# Patient Record
Sex: Male | Born: 1985 | Race: Black or African American | Hispanic: No | Marital: Married | Smoking: Never smoker
Health system: Southern US, Community
[De-identification: ages and names within clinical notes are randomized; demographics above are authoritative.]

## PROBLEM LIST (undated history)

## (undated) DIAGNOSIS — J45909 Unspecified asthma, uncomplicated: Secondary | ICD-10-CM

## (undated) DIAGNOSIS — E119 Type 2 diabetes mellitus without complications: Secondary | ICD-10-CM

---

## 2009-11-01 ENCOUNTER — Ambulatory Visit: Payer: Self-pay | Admitting: Diagnostic Radiology

## 2009-11-01 ENCOUNTER — Encounter: Payer: Self-pay | Admitting: Emergency Medicine

## 2010-02-17 ENCOUNTER — Inpatient Hospital Stay (HOSPITAL_COMMUNITY): Admission: EM | Admit: 2010-02-17 | Discharge: 2009-11-03 | Payer: Self-pay | Admitting: Emergency Medicine

## 2010-05-27 LAB — BASIC METABOLIC PANEL
CO2: 29 mEq/L (ref 19–32)
Creatinine, Ser: 1.2 mg/dL (ref 0.4–1.5)
GFR calc Af Amer: >60 mL/min (ref >60)
GFR calc non Af Amer: >60 mL/min (ref >60)

## 2010-05-27 LAB — CULTURE, BLOOD (ROUTINE X 2)

## 2010-05-27 LAB — WOUND CULTURE: Gram Stain: NONE SEEN

## 2010-05-27 LAB — DIFFERENTIAL
Basophils Absolute: 0 10*3/uL (ref 0.0–0.1)
Basophils Relative: 1 % (ref 0–1)
Lymphs Abs: 2.3 10*3/uL (ref 0.7–4.0)
Neutro Abs: 1.9 10*3/uL (ref 1.7–7.7)
Neutrophils Relative %: 37 % — ABNORMAL LOW (ref 43–77)

## 2010-05-27 LAB — ANAEROBIC CULTURE: Gram Stain: NONE SEEN

## 2010-05-27 LAB — CBC
Hemoglobin: 13.5 g/dL (ref 13.0–17.0)
MCHC: 34 g/dL (ref 30.0–36.0)
RBC: 4.25 MIL/uL (ref 4.22–5.81)

## 2011-01-04 ENCOUNTER — Encounter: Payer: Self-pay | Admitting: Hematology and Oncology

## 2011-01-18 ENCOUNTER — Other Ambulatory Visit: Payer: Self-pay | Admitting: Hematology and Oncology

## 2011-01-18 ENCOUNTER — Ambulatory Visit: Payer: 59

## 2011-01-18 ENCOUNTER — Ambulatory Visit (HOSPITAL_BASED_OUTPATIENT_CLINIC_OR_DEPARTMENT_OTHER): Payer: 59 | Admitting: Hematology and Oncology

## 2011-01-18 ENCOUNTER — Ambulatory Visit: Payer: Medicare HMO

## 2011-01-18 VITALS — BP 129/80 | HR 47 | Temp 98.1°F | Ht 74.5 in | Wt 260.6 lb

## 2011-01-18 DIAGNOSIS — D539 Nutritional anemia, unspecified: Secondary | ICD-10-CM

## 2011-01-18 DIAGNOSIS — D649 Anemia, unspecified: Secondary | ICD-10-CM

## 2011-01-18 LAB — URINALYSIS, MICROSCOPIC - CHCC
Bilirubin (Urine): NEGATIVE
Glucose: NEGATIVE g/dL
Leukocyte Esterase: NEGATIVE
Nitrite: NEGATIVE
Specific Gravity, Urine: 1.025 (ref 1.003–1.035)

## 2011-01-18 LAB — RETICULOCYTES: Retic Ct Abs: 49.14 10*3/uL (ref 34.80–93.90)

## 2011-01-18 LAB — CHCC SMEAR

## 2011-01-18 NOTE — Progress Notes (Signed)
This office note has been dictated.

## 2011-01-18 NOTE — Progress Notes (Signed)
NO    [facility]     [place]  [facility]  On  [address]  Cell     Phone      [phone].

## 2011-01-19 NOTE — Progress Notes (Signed)
CC:   Marye Round, MD  IDENTIFYING STATEMENT:  The patient is a 25 year old man seen at request of Dr. Derrek Gu with anemia.  HISTORY OF PRESENT ILLNESS:  The patient was undergoing routine physical exam and was found to be mildly anemic.  He has no past medical history and is in general good health.  He states that he has had no loss in weight.  He denies rectal bleeding, hemoptysis hematemesis.  He does not think he has a personal or family history for sickle cell disease.  He eats a well-balanced diet.  He has good energy levels.  He denies musculoskeletal pain.  CBC at an office visit on 12/26/2010 had noted a white cell count of 3.4, hemoglobin 13, hematocrit 39, platelets 165.  The patient does not have other lab values on file to review at this time.  PAST MEDICAL HISTORY: 1. Remote history of asthma. 2. Status post I and D following a dog bit to thumb. 3. Status post wisdom tooth extraction.  MEDICATIONS:  Ibuprofen 500 mg b.i.d.  Completing course of penicillin following tooth extraction.  ALLERGIES:  None.  SOCIAL HISTORY:  The patient is engaged.  He is a Armed forces training and education officer. Denies alcohol, tobacco use.  FAMILY HISTORY:  Patient's maternal grandmother and father had anemia and both were managed with iron.  Denies hematological or oncologic malignancies.  REVIEW OF SYSTEMS:  Denies fever, chills, night sweats, anorexia, weight loss.  Cardiovascular:  Denies chest pain, PND, orthopnea, ankle swelling.  Respirations:  Denies cough hemoptysis, wheeze, shortness of breath.  Musculoskeletal:  Denies joint aches, muscle pains. Neurologic:  Denies headaches, vision changes, extremity weakness.  Rest of review of systems negative.  PHYSICAL EXAM:  General:  The patient is alert and oriented x3.  Vitals signs:  Pulse 47, blood pressure 129/80, temperature 98.1, respirations 20, weight 260 pounds.  HEENT:  Head is atraumatic, normocephalic. Extraocular muscles  intact.  Sclerae anicteric.  Pupils equal, round, reactive to light.  Mouth moist without ulcerations, thrush, or lesions. Neck:  Supple without adenopathy and trachea center.  Chest: Demonstrates good entry bilaterally and clear to both percussion auscultation.  Cardiovascular:  Reveals 1st and 2nd heart sounds are present.  No added sounds or murmurs.  Abdomen:  Soft, nontender.  Bowel sounds present.  Extremities:  No calf tenderness.  Pulses present and symmetrical.  Lymph nodes:  No palpable adenopathy.  CNS:  Nonfocal.  IMPRESSION AND PLAN:  Mr. Wain is a 25 year old man evaluated for anemia which appears to be mild.  His history and exam are essentially unremarkable.  He will proceed with anemia workup that will include a repeat CBC with differential and repeat smear.  We will also analyze LFTs.  We will rule out hemolysis with Coombs, haptoglobin and LDH.  We will also obtain a hemoglobin electrophoresis to rule out hemoglobinopathy.  He is given card for occult blood testing.  He will also obtain urinalysis.  We spent time discussing potential etiologies of anemia.  All questions were answered.  We spent 50% of time discussing potential outcomes and recommendations.  The patient follows up to discuss results.    ______________________________ Laurice Record, M.D. LIO/MEDQ  D:  01/18/2011  T:  01/18/2011  Job:  [id number]

## 2011-01-20 LAB — HEMOGLOBINOPATHY EVALUATION
Hemoglobin Other: 0 %
Hgb A2 Quant: 3 % (ref 2.2–3.2)
Hgb F Quant: 0 % (ref 0.0–2.0)

## 2011-01-24 ENCOUNTER — Ambulatory Visit (HOSPITAL_BASED_OUTPATIENT_CLINIC_OR_DEPARTMENT_OTHER): Payer: 59

## 2011-01-24 ENCOUNTER — Other Ambulatory Visit: Payer: Self-pay | Admitting: *Deleted

## 2011-01-24 DIAGNOSIS — D72819 Decreased white blood cell count, unspecified: Secondary | ICD-10-CM

## 2011-01-24 DIAGNOSIS — D539 Nutritional anemia, unspecified: Secondary | ICD-10-CM

## 2011-01-24 DIAGNOSIS — D649 Anemia, unspecified: Secondary | ICD-10-CM

## 2011-01-24 LAB — CBC WITH DIFFERENTIAL/PLATELET
Eosinophils Absolute: 0.1 10*3/uL (ref 0.0–0.5)
MCV: 91.3 fL (ref 79.3–98.0)
MONO#: 0.2 10*3/uL (ref 0.1–0.9)
MONO%: 7 % (ref 0.0–14.0)
NEUT#: 1.4 10*3/uL — ABNORMAL LOW (ref 1.5–6.5)
RBC: 4.05 10*6/uL — ABNORMAL LOW (ref 4.20–5.82)
RDW: 12.4 % (ref 11.0–14.6)
WBC: 3.1 10*3/uL — ABNORMAL LOW (ref 4.0–10.3)
lymph#: 1.4 10*3/uL (ref 0.9–3.3)

## 2011-01-25 LAB — COMPREHENSIVE METABOLIC PANEL
ALT: 44 U/L (ref 0–53)
Alkaline Phosphatase: 51 U/L (ref 39–117)
CO2: 28 mEq/L (ref 19–32)
Sodium: 140 mEq/L (ref 135–145)
Total Bilirubin: 0.5 mg/dL (ref 0.3–1.2)
Total Protein: 7.6 g/dL (ref 6.0–8.3)

## 2011-01-25 LAB — FOLATE: Folate: 9.7 ng/mL

## 2011-01-25 LAB — LACTATE DEHYDROGENASE: LDH: 145 U/L (ref 94–250)

## 2011-01-25 LAB — FERRITIN: Ferritin: 48 ng/mL (ref 22–322)

## 2011-01-25 LAB — IRON AND TIBC: Iron: 73 ug/dL (ref 42–165)

## 2011-01-26 ENCOUNTER — Ambulatory Visit (HOSPITAL_BASED_OUTPATIENT_CLINIC_OR_DEPARTMENT_OTHER): Payer: 59 | Admitting: Hematology and Oncology

## 2011-01-26 VITALS — BP 146/71 | HR 64 | Temp 98.6°F | Ht 74.5 in | Wt 252.4 lb

## 2011-01-26 DIAGNOSIS — D72819 Decreased white blood cell count, unspecified: Secondary | ICD-10-CM

## 2011-01-26 DIAGNOSIS — D539 Nutritional anemia, unspecified: Secondary | ICD-10-CM | POA: Insufficient documentation

## 2011-01-26 DIAGNOSIS — D649 Anemia, unspecified: Secondary | ICD-10-CM

## 2011-01-26 NOTE — Progress Notes (Signed)
This office note has been dictated.

## 2011-01-26 NOTE — Progress Notes (Signed)
CC:   Jeanice Lim, M.D.  IDENTIFYING STATEMENT:  The patient is a 25 year old man who presents to discuss results.  INTERVAL HISTORY:  Mr. Germond was found to have mild anemia following a recent routine physical.  He was asymptomatic with no significant past medical history.  There is no history for overt blood loss.  His weight has been stable.  The patient's lab results are as follows:  On 01/24/2011, white cell count 3.1, hemoglobin 12.8, hematocrit 37, platelets 196, ANC 1,400. Review of peripheral smear was essentially unremarkable; iron 73, TIBC 229, saturation 32%, ferritin 48, B12 of 382, folate 9.7; there was no evidence of hemolysis as the haptoglobin was normal at 106, LDH 145, and direct and indirect Coombs negative; fecal occult blood testing x3 was negative; a urinalysis was negative for hematuria; hemoglobin electrophoresis did not reveal hemoglobinopathy.  The retic count was normal at 1.30.  PAST MEDICAL HISTORY:  Unchanged.  SOCIAL HISTORY:  Unchanged.  FAMILY HISTORY:  Unchanged.  MEDICATIONS:  Reviewed and updated.  ALLERGIES:  None.  REVIEW OF SYSTEMS:  A 10-point review of systems is negative.  PHYSICAL EXAMINATION:  General Appearance:  The patient is a well- appearing, well-nourished man in no distress.  Vital Signs:  Pulse 64. Blood pressure 146/71.  Temp 98.6.  Respirations 20.  Weight 252 pounds. HEENT:  Head is atraumatic, normocephalic.  Sclerae are anicteric. Mouth is moist.  No thrush.  Chest:  Clear.  CVS:  Unremarkable. Abdomen:  Soft, nontender.  Bowel sounds are present.  Extremities:  No calf tenderness.  No edema.  Lymph Nodes:  No adenopathy.  RUE:[id number].  LABORATORY DATA:  As above.  In addition, sodium 140, potassium 4.4, chloride 104, CO2 of 25, BUN 17, creatinine 1.09, glucose 84, t. bili 0.5, alkaline phosphatase 51, AST 22, ALT 44, calcium 9.8.  IMPRESSION AND PLAN:  Mr. Loomer is a 25 year old man with mild leukopenia and mild  anemia.  He is asymptomatic.  Likely, this may be nutritional as his B12 levels are slightly on the lower side of normal and his ferritin level is also on the lower scale of normal.  I have asked that he begin a multivitamin.  Begin over-the-counter iron once  daily for 3 months, and B12 100 mcg daily for 3 months.    He will followup in 6 months' time with repeat labs.    ______________________________ Laurice Record, M.D. LIO/MEDQ  D:  01/26/2011  T:  01/26/2011  Job:  [id number]

## 2011-08-10 ENCOUNTER — Other Ambulatory Visit: Payer: Self-pay | Admitting: *Deleted

## 2011-08-15 ENCOUNTER — Telehealth: Payer: Self-pay | Admitting: Hematology and Oncology

## 2011-08-15 NOTE — Telephone Encounter (Signed)
Per 5/30 pof moved 6/12 f/u from LO to [facility] and lb b4 visit. S/w pt today he is aware of new lb d/t for 6/5 @ 1:45 pm. F/u remains 6/12 @ 11 am.

## 2011-08-16 ENCOUNTER — Other Ambulatory Visit (HOSPITAL_BASED_OUTPATIENT_CLINIC_OR_DEPARTMENT_OTHER): Payer: Self-pay | Admitting: Lab

## 2011-08-16 DIAGNOSIS — D72819 Decreased white blood cell count, unspecified: Secondary | ICD-10-CM

## 2011-08-16 DIAGNOSIS — D649 Anemia, unspecified: Secondary | ICD-10-CM

## 2011-08-16 LAB — BASIC METABOLIC PANEL
CO2: 27 mEq/L (ref 19–32)
Calcium: 9.6 mg/dL (ref 8.4–10.5)
Chloride: 105 mEq/L (ref 96–112)
Creatinine, Ser: 0.93 mg/dL (ref 0.50–1.35)
Glucose, Bld: 106 mg/dL — ABNORMAL HIGH (ref 70–99)
Sodium: 139 mEq/L (ref 135–145)

## 2011-08-16 LAB — CBC WITH DIFFERENTIAL/PLATELET
Basophils Absolute: 0 10*3/uL (ref 0.0–0.1)
Eosinophils Absolute: 0.1 10*3/uL (ref 0.0–0.5)
HGB: 12.8 g/dL — ABNORMAL LOW (ref 13.0–17.1)
LYMPH%: 56.2 % — ABNORMAL HIGH (ref 14.0–49.0)
MCV: 91.2 fL (ref 79.3–98.0)
MONO#: 0.2 10*3/uL (ref 0.1–0.9)
MONO%: 5.3 % (ref 0.0–14.0)
NEUT#: 1.1 10*3/uL — ABNORMAL LOW (ref 1.5–6.5)
Platelets: 166 10*3/uL (ref 140–400)
RBC: 4.07 10*6/uL — ABNORMAL LOW (ref 4.20–5.82)
WBC: 3.3 10*3/uL — ABNORMAL LOW (ref 4.0–10.3)
nRBC: 0 % (ref 0–0)

## 2011-08-16 LAB — IRON AND TIBC
Iron: 63 ug/dL (ref 42–165)
UIBC: 185 ug/dL (ref 125–400)

## 2011-08-16 LAB — FERRITIN: Ferritin: 51 ng/mL (ref 22–322)

## 2011-08-22 ENCOUNTER — Telehealth: Payer: Self-pay | Admitting: Nurse Practitioner

## 2011-08-22 ENCOUNTER — Other Ambulatory Visit: Payer: Self-pay | Admitting: Nurse Practitioner

## 2011-08-22 ENCOUNTER — Telehealth: Payer: Self-pay | Admitting: Hematology and Oncology

## 2011-08-22 NOTE — Telephone Encounter (Signed)
Informed pt per Dr. Dalene Carrow- this office will not give lab results over phone.  Pt needs to come to scheduled appointment.  Pt stated he took today instead of tomorrow off of work erroneously and cannot come tomorrow.  RN informed patient will reschedule appointment and scheduler will call with new appointment date and time.  Encouraged patient to keep appointment as scheduled.

## 2011-08-22 NOTE — Telephone Encounter (Signed)
Patient called and stated he can not come to scheduled appt tomorrow with mid level.  Inquired if he could cancel followup appointment and receive lab results via telephone.  Information to Dr. Dalene Carrow for review and instructions.

## 2011-08-22 NOTE — Telephone Encounter (Signed)
lmonvm for pt re new appt for 6/26 @ 2 pm. Per 6/11 pof r/s 6/12 appt to next available w/[facility].

## 2011-08-23 ENCOUNTER — Ambulatory Visit: Payer: 59 | Admitting: Hematology and Oncology

## 2011-08-23 ENCOUNTER — Other Ambulatory Visit: Payer: 59 | Admitting: Lab

## 2011-08-23 ENCOUNTER — Ambulatory Visit: Payer: 59 | Admitting: Nurse Practitioner

## 2011-09-06 ENCOUNTER — Ambulatory Visit (HOSPITAL_BASED_OUTPATIENT_CLINIC_OR_DEPARTMENT_OTHER): Payer: Self-pay | Admitting: Nurse Practitioner

## 2011-09-06 ENCOUNTER — Encounter: Payer: Self-pay | Admitting: Nurse Practitioner

## 2011-09-06 VITALS — BP 131/70 | HR 60 | Temp 97.0°F | Ht 74.5 in | Wt 256.6 lb

## 2011-09-06 DIAGNOSIS — D72819 Decreased white blood cell count, unspecified: Secondary | ICD-10-CM

## 2011-09-06 DIAGNOSIS — D649 Anemia, unspecified: Secondary | ICD-10-CM

## 2011-09-06 NOTE — Progress Notes (Signed)
[facility] OFFICE PROGRESS NOTE  MANNING, Adelene Amas., MD [address] [place] [address]  DIAGNOSIS: Anemia   CURRENT THERAPY: Observation  INTERVAL HISTORY: Danella Deis 26 y.o. male returns for  regular visit for followup of history of anemia and leukopenia. He has been doing very well since we last saw him in November. He relates he actually quit taking Vitamin B12 and oral iron at the end of March. He has specifically denied any pagophagia or fatigue, and feels quite well overall.  MEDICAL HISTORY:No past medical history on file.  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    ALLERGIES:   has no known allergies.  MEDICATIONS: Mr. Havey does not currently have medications on file.  SURGICAL HISTORY: No past surgical history on file.  REVIEW OF SYSTEMS:  A comprehensive review of systems was negative.   PHYSICAL EXAMINATION: ECOG PERFORMANCE STATUS: 0 - Asymptomatic  There were no vitals filed for this visit.  GENERAL:alert, healthy, no distress, well nourished, well developed, comfortable and cooperative SKIN: skin color, texture, turgor are normal HEAD: Normocephalic EYES: normal, PERRLA, EOMI, Conjunctiva are pink and non-injected, sclera clear EARS: External ears normal OROPHARYNX:no exudate, no erythema and lips, buccal mucosa, and tongue normal  NECK: supple LYMPH:  not examined BREAST:not examined LUNGS: clear to auscultation and percussion HEART: regular rate & rhythm, no murmurs, no gallops, S1 normal and S2 normal ABDOMEN:abdomen soft, non-tender, normal bowel sounds and no masses or organomegaly BACK: Back symmetric, no curvature. EXTREMITIES:no edema, no clubbing, no cyanosis  NEURO: alert & oriented x 3 with fluent speech, no focal motor/sensory deficits, gait normal PELVIC: not examined RECTAL: not examined    LABORATORY DATA: WBC  Date/Time Value Range Status  08/16/2011  1:57 PM  3.3* 4.0 - 10.3 10e3/uL Final  11/01/2009  4:27 AM 5.1  4.0 - 10.5 K/uL Final     HGB  Date/Time Value Range Status  08/16/2011  1:57 PM 12.8* 13.0 - 17.1 g/dL Final     Hemoglobin  Date/Time Value Range Status  11/01/2009  4:27 AM 13.5  13.0 - 17.0 g/dL Final     HCT  Date/Time Value Range Status  08/16/2011  1:57 PM 37.1* 38.4 - 49.9 % Final  11/01/2009  4:27 AM 39.7  39.0 - 52.0 % Final     Platelets  Date/Time Value Range Status  08/16/2011  1:57 PM 166  140 - 400 10e3/uL Final  11/01/2009  4:27 AM 164  150 - 400 K/uL Final     Ferritin  Date/Time Value Range Status  08/16/2011  1:57 PM 51  22 - 322 ng/mL Final     Iron  Date/Time Value Range Status  08/16/2011  1:57 PM 63  42 - 165 ug/dL Final     Folate  Date/Time Value Range Status  01/24/2011 11:30 AM 9.7   Final      Reference Ranges        Deficient:       0.4 - 3.3 ng/mL        Indeterminate:   3.4 - 5.4 ng/mL        Normal:              > 5.4 ng/mL     TIBC  Date/Time Value Range Status  08/16/2011  1:57 PM 248  215 - 435 ug/dL Final     LDH  Date/Time Value Range Status  01/24/2011 11:30 AM 145  94 - 250 U/L Final    RADIOGRAPHIC STUDIES: No results found.   ASSESSMENT: 6 yr black male with history of mild anemia of unspecified deficiency, and leukopenia.  Most recent labs from 08/16/2011 show Vitamin B12 level of 523 (this is higher than it was in November at 382) despite his stopping all oral supplementation back in March. His most recent ferritin level is 51 (it was 48 in November). His WBC was 3.3, his HGB was 12.8, HCT was 37.1 and platelets were 166k. His TIBC was 248, saturation 25%.  He denies any bruising or bleeding. He had a comprehensive work up when he was originally referred to [place] - urinanalysis was negative for blood, fecal occult blood testing x 3 was negative; hemoglobin electrophoresis did not reveal hemoglobinopathy. He does have a family history of grandparents who were anemia due to iron  deficiency.   PLAN: We encouraged patient to take oral iron, especially should he notice fatigue. His levels are currently within or close to normal range, and he is certainly tolerating them without any difficulty whatsoever.  However, based on current labs, physical exam and review of symptoms he is good to follow up annually with his primary care physician. We are certainly happy to see him back if he has a further drop in his ferritin level, if his hemoglobin drops or he becomes symptomatic. Thank you for allowing [place] to participate in his care.  All questions were answered. The patient knows to call the clinic with any problems, questions or concerns.  The patient and plan discussed with Arlan Organ, MD and she is in agreement with the aforementioned.  I spent 15 minutes counseling the patient face to face. The total time spent in the appointment was 20 minutes. Bobbe Medico, AOCNP, NP-C

## 2011-09-22 ENCOUNTER — Other Ambulatory Visit: Payer: 59 | Admitting: Lab

## 2012-06-27 ENCOUNTER — Emergency Department (HOSPITAL_COMMUNITY)
Admission: EM | Admit: 2012-06-27 | Discharge: 2012-06-27 | Disposition: A | Discharge status: Home / Self Care | Payer: Worker's Compensation | Attending: Emergency Medicine | Admitting: Emergency Medicine

## 2012-06-27 DIAGNOSIS — Z203 Contact with and (suspected) exposure to rabies: Secondary | ICD-10-CM | POA: Insufficient documentation

## 2012-06-27 DIAGNOSIS — R509 Fever, unspecified: Secondary | ICD-10-CM | POA: Insufficient documentation

## 2012-06-27 DIAGNOSIS — Z23 Encounter for immunization: Secondary | ICD-10-CM | POA: Insufficient documentation

## 2012-06-27 DIAGNOSIS — Z791 Long term (current) use of non-steroidal anti-inflammatories (NSAID): Secondary | ICD-10-CM | POA: Insufficient documentation

## 2012-06-27 MED ORDER — RABIES IMMUNE GLOBULIN 150 UNIT/ML IM INJ
20.0000 [IU]/kg | INJECTION | Freq: Once | INTRAMUSCULAR | Status: AC
  Administered 2012-06-27: 2475 [IU] via INTRAMUSCULAR
  Filled 2012-06-27: qty 16.5

## 2012-06-27 MED ORDER — RABIES VACCINE, PCEC IM SUSR
1.0000 mL | Freq: Once | INTRAMUSCULAR | Status: AC
  Administered 2012-06-27: 1 mL via INTRAMUSCULAR
  Filled 2012-06-27: qty 1

## 2012-06-27 NOTE — ED Notes (Signed)
Flow manager contacted regarding pt follow up.

## 2012-06-27 NOTE — ED Provider Notes (Signed)
History    This chart was scribed for non-physician practitioner working with Brandon Cooper III, MD by Sofie Rower, ED Scribe. This patient was seen in room TR05C/TR05C and the patient's care was started at 3:15PM   CSN: [id number]  Arrival date & time 06/27/12  1455   First MD Initiated Contact with Patient 06/27/12 1515      Chief Complaint  Patient presents with  . Follow-up    (Consider location/radiation/quality/duration/timing/severity/associated sxs/prior treatment) Patient is a 27 y.o. male presenting with general illness. The history is provided by the patient. No language interpreter was used.  Illness  The current episode started 3 to 5 days ago (5 days ago). The onset was sudden. The problem occurs rarely. The problem has been unchanged. The problem is moderate. Nothing relieves the symptoms. Nothing aggravates the symptoms. He has been behaving normally.    Brandon Henry is a 27 y.o. male , with no known medical hx, who presents to the Emergency Department complaining of rabies exposure, follow up, onset five days ago (06/22/12). The pt reports he works at a [employer] where he recently came into contact with a kitten whom has been diagnosed with rabies. The contact occurred on Saturday, 06/22/12, however there are no injuries nor scratches associated with the incident. The pt informs he has become concerned ever since learning the feline he handled on Saturday, 06/22/12, has been diagnosed with rabies. This newfound information has prompted the pt's desire to seek medical evaluation at [facility] today (06/27/12).  The pt does not smoke or drink alcohol.      No past medical history on file.  No past surgical history on file.  No family history on file.  History  Substance Use Topics  . Smoking status: Not on file  . Smokeless tobacco: Not on file  . Alcohol Use: Not on file      Review of Systems  Skin: Negative for wound.  All other systems reviewed and are  negative.    Allergies  Review of patient's allergies indicates no known allergies.  Home Medications   Current Outpatient Rx  Name  Route  Sig  Dispense  Refill  . ibuprofen (ADVIL,MOTRIN) 400 MG tablet   Oral   Take 400 mg by mouth 2 (two) times daily.             BP 140/75  Pulse 71  Temp(Src) 98.1 F (36.7 C) (Oral)  Resp 16  Ht 6' 2.75" (1.899 m)  Wt 274 lb (124.286 kg)  BMI 34.46 kg/m2  SpO2 100%  Physical Exam  Nursing note and vitals reviewed. Constitutional: He is oriented to person, place, and time. He appears well-developed and well-nourished. No distress.  HENT:  Head: Normocephalic and atraumatic.  Eyes: EOM are normal.  Neck: Neck supple. No tracheal deviation present.  Cardiovascular: Normal rate.   Pulmonary/Chest: Effort normal. No respiratory distress.  Musculoskeletal: Normal range of motion.  Neurological: He is alert and oriented to person, place, and time.  Skin: Skin is warm and dry.  Psychiatric: He has a normal mood and affect. His behavior is normal.    ED Course  Procedures (including critical care time)  DIAGNOSTIC STUDIES: Oxygen Saturation is 100% on room air, normal by my interpretation.    COORDINATION OF CARE:   3:18 PM- Treatment plan concerning consultation with attending physician discussed with patient. Pt agrees with treatment.  3:41 PM- Recheck. Treatment plan concerning administration of rabies vaccinations discussed with patient. Pt agrees with  treatment.         Labs Reviewed - No data to display No results found.   No diagnosis found.    MDM    I personally performed the services described in this documentation, which was scribed in my presence. The recorded information has been reviewed and is accurate.        Jimmye Norman, NP 06/27/12 [phone]

## 2012-06-27 NOTE — ED Notes (Signed)
To ED for Rabies series after handling a cat last week that was confirmed rabies today.

## 2012-06-28 NOTE — ED Provider Notes (Signed)
Medical screening examination/treatment/procedure(s) were performed by non-physician practitioner and as supervising physician I was immediately available for consultation/collaboration.   Cartrell Bentsen III, MD 06/28/12 1242 

## 2012-06-30 ENCOUNTER — Emergency Department (INDEPENDENT_AMBULATORY_CARE_PROVIDER_SITE_OTHER)
Admission: EM | Admit: 2012-06-30 | Discharge: 2012-06-30 | Disposition: A | Discharge status: Home / Self Care | Payer: Worker's Compensation | Source: Home / Self Care

## 2012-06-30 DIAGNOSIS — Z203 Contact with and (suspected) exposure to rabies: Secondary | ICD-10-CM

## 2012-06-30 MED ORDER — RABIES VACCINE, PCEC IM SUSR
1.0000 mL | Freq: Once | INTRAMUSCULAR | Status: AC
  Administered 2012-06-30: 1 mL via INTRAMUSCULAR

## 2012-06-30 MED ORDER — RABIES VACCINE, PCEC IM SUSR
INTRAMUSCULAR | Status: AC
  Filled 2012-06-30: qty 1

## 2012-06-30 NOTE — ED Notes (Addendum)
Present for rabies vaccination; day 3.

## 2012-07-04 ENCOUNTER — Emergency Department (INDEPENDENT_AMBULATORY_CARE_PROVIDER_SITE_OTHER)
Admission: EM | Admit: 2012-07-04 | Discharge: 2012-07-04 | Disposition: A | Discharge status: Home / Self Care | Payer: Worker's Compensation | Source: Home / Self Care

## 2012-07-04 ENCOUNTER — Encounter (HOSPITAL_COMMUNITY): Payer: Self-pay | Admitting: *Deleted

## 2012-07-04 DIAGNOSIS — Z203 Contact with and (suspected) exposure to rabies: Secondary | ICD-10-CM

## 2012-07-04 MED ORDER — RABIES VACCINE, PCEC IM SUSR
1.0000 mL | Freq: Once | INTRAMUSCULAR | Status: AC
  Administered 2012-07-04: 1 mL via INTRAMUSCULAR

## 2012-07-04 MED ORDER — RABIES VACCINE, PCEC IM SUSR
INTRAMUSCULAR | Status: AC
  Filled 2012-07-04: qty 1

## 2012-07-04 NOTE — ED Notes (Signed)
Pt  Here  For      Rabies  Shot     Day  7     Verbalizes  No  Complaints

## 2012-07-11 ENCOUNTER — Emergency Department (INDEPENDENT_AMBULATORY_CARE_PROVIDER_SITE_OTHER)
Admission: EM | Admit: 2012-07-11 | Discharge: 2012-07-11 | Disposition: A | Discharge status: Home / Self Care | Payer: Worker's Compensation | Source: Home / Self Care

## 2012-07-11 ENCOUNTER — Encounter (HOSPITAL_COMMUNITY): Payer: Self-pay | Admitting: *Deleted

## 2012-07-11 DIAGNOSIS — Z203 Contact with and (suspected) exposure to rabies: Secondary | ICD-10-CM

## 2012-07-11 MED ORDER — RABIES VACCINE, PCEC IM SUSR
INTRAMUSCULAR | Status: AC
  Filled 2012-07-11: qty 1

## 2012-07-11 MED ORDER — RABIES VACCINE, PCEC IM SUSR
1.0000 mL | Freq: Once | INTRAMUSCULAR | Status: AC
  Administered 2012-07-11: 1 mL via INTRAMUSCULAR

## 2012-07-11 NOTE — ED Notes (Signed)
Pt  Here  For  The  Last  Of  His  Rabies   Vaccine      He  Verbalizes  No  Complaints

## 2013-01-16 ENCOUNTER — Other Ambulatory Visit: Payer: Self-pay

## 2015-03-14 ENCOUNTER — Emergency Department (INDEPENDENT_AMBULATORY_CARE_PROVIDER_SITE_OTHER)
Admission: EM | Admit: 2015-03-14 | Discharge: 2015-03-14 | Disposition: A | Discharge status: Home / Self Care | Payer: PRIVATE HEALTH INSURANCE | Source: Home / Self Care

## 2015-03-14 ENCOUNTER — Encounter (HOSPITAL_COMMUNITY): Payer: Self-pay | Admitting: *Deleted

## 2015-03-14 DIAGNOSIS — N50819 Testicular pain, unspecified: Secondary | ICD-10-CM | POA: Diagnosis not present

## 2015-03-14 HISTORY — DX: Unspecified asthma, uncomplicated: J45.909

## 2015-03-14 NOTE — Discharge Instructions (Signed)
Scrotal Hematoma Scrotal hematoma is the collection of blood inside the sac (scrotum) that contains the testicles, the blood vessels that serve the testicles, and the structures that help deliver sperm and semen. The rich blood supply in this area makes it easy for a significant amount of blood to collect in the scrotum even after a minor injury or a small operation like a vasectomy. In addition, the tissues in the scrotum are very loose. There is no pressure from the surrounding tissue to help stop bleeding once it starts.  HOME CARE INSTRUCTIONS  Monitor your hematoma for any changes. The following actions may help to alleviate any discomfort you are experiencing:  Apply ice to the injured area:  Put ice in a plastic bag.  Place a towel between your skin and the bag.  Leave the ice on for 20 minutes, 2-3 times a day.  Use scrotal support.  Take appropriate pain medications prescribed by your health care provider.  Minimize physical activity and avoid any activities that would place direct pressure on the scrotum and penis (such as bicycling and horseback riding).  Avoid sexual activity until advised otherwise by your health care provider.  If your health care provider has given you a follow-up appointment, it is very important to keep that appointment. SEEK MEDICAL CARE IF:  You have cloudy or dark urine.  You have frequent urination.  You develop scrotal swelling that does not improve as expected after 4 days. SEEK IMMEDIATE MEDICAL CARE IF:  You develop recurrent or severe pain that is not controlled by prescribed medicine.  You have nausea or vomiting.  There is increased swelling of the scrotum.  You have increased abdominal pain.  You have a fever or chills.  You have difficulty starting urination.  You have painful urination.  Your urine flow is slow.  There is blood in your urine.  You are unable to urinate.  You experience redness spreading from your  scrotum into your groin or thighs. MAKE SURE YOU:  Understand these instructions.  Will watch your condition.  Will get help right away if you are not doing well or get worse.   This information is not intended to replace advice given to you by your health care provider. Make sure you discuss any questions you have with your health care provider.   Document Released: 05/29/2006 Document Revised: 10/30/2012 Document Reviewed: 08/01/2012 Elsevier Interactive Patient Education [employer].

## 2015-03-14 NOTE — ED Provider Notes (Signed)
CSN: [id number]     Arrival date & time 03/14/15  1927 History   None    No chief complaint on file.  (Consider location/radiation/quality/duration/timing/severity/associated sxs/prior Treatment) HPI Testicle pain left scrotum since Friday, states that he scrotum was twisted by boxers that he was wearing. Has had pain since that time. Pain is getting better each day. Was looking on internet and became concerned about a couple of issues. Denies any drainage from his penis, no change in sexual partner. No past medical history on file. No past surgical history on file. No family history on file. Social History  Substance Use Topics  . Smoking status: Not on file  . Smokeless tobacco: Not on file  . Alcohol Use: Not on file    Review of Systems ROS +'ve scrotal pain  Denies: HEADACHE, NAUSEA, ABDOMINAL PAIN, CHEST PAIN, CONGESTION, DYSURIA, SHORTNESS OF BREATH  Allergies  Review of patient's allergies indicates no known allergies.  Home Medications   Prior to Admission medications   Medication Sig Start Date End Date Taking? Authorizing Provider  Ascorbic Acid (VITAMIN C PO) Take 1 tablet by mouth 2 (two) times daily as needed (for cold symptoms).    Historical Provider, MD  cetirizine (ZYRTEC) 10 MG tablet Take 10 mg by mouth daily.    Historical Provider, MD  FERROUS SULFATE PO Take 1 tablet by mouth every evening.    Historical Provider, MD  HYDROCORTISONE PO Take 1 application by mouth daily.    Historical Provider, MD  Multiple Vitamin (MULTIVITAMIN WITH MINERALS) TABS Take 2 tablets by mouth daily. Adult gummies    Historical Provider, MD   Meds Ordered and Administered this Visit  Medications - No data to display  There were no vitals taken for this visit. No data found.   Physical Exam  Constitutional: He is oriented to person, place, and time. He appears well-developed and well-nourished.  HENT:  Head: Normocephalic and atraumatic.  Pulmonary/Chest: Effort normal.   Abdominal: Hernia confirmed negative in the left inguinal area.  Genitourinary: Penis normal. Cremasteric reflex is present. Left testis shows tenderness. Left testis shows no swelling. Left testis is descended. Cremasteric reflex is not absent on the left side. Circumcised.  Musculoskeletal: Normal range of motion.  Lymphadenopathy:       Left: No inguinal adenopathy present.  Neurological: He is alert and oriented to person, place, and time.  Skin: Skin is warm and dry.  Psychiatric: He has a normal mood and affect. His behavior is normal. Judgment and thought content normal.  Nursing note and vitals reviewed.   ED Course  Procedures (including critical care time)  Labs Review Labs Reviewed - No data to display  Imaging Review No results found.   Visual Acuity Review  Right Eye Distance:   Left Eye Distance:   Bilateral Distance:    Right Eye Near:   Left Eye Near:    Bilateral Near:         MDM   1. Testicular discomfort    Pt is advised for definitive dx he should get an ultrasound.  Continue symptomatic treatment at home.  Pt will monitor his pain since it seems that it is getting better.      Konrad Felix, PA 03/14/15 2004

## 2015-03-14 NOTE — ED Notes (Signed)
Started with testicular pain 2 days ago after pt's boxers got twisted.  Has continued with pain.

## 2015-12-03 ENCOUNTER — Encounter (HOSPITAL_COMMUNITY): Payer: Self-pay | Admitting: *Deleted

## 2015-12-03 ENCOUNTER — Emergency Department (HOSPITAL_COMMUNITY): Payer: PRIVATE HEALTH INSURANCE

## 2015-12-03 ENCOUNTER — Emergency Department (HOSPITAL_COMMUNITY)
Admission: EM | Admit: 2015-12-03 | Discharge: 2015-12-04 | Disposition: A | Discharge status: Home / Self Care | Payer: PRIVATE HEALTH INSURANCE | Attending: Emergency Medicine | Admitting: Emergency Medicine

## 2015-12-03 DIAGNOSIS — J45909 Unspecified asthma, uncomplicated: Secondary | ICD-10-CM | POA: Diagnosis not present

## 2015-12-03 DIAGNOSIS — S86012A Strain of left Achilles tendon, initial encounter: Secondary | ICD-10-CM | POA: Insufficient documentation

## 2015-12-03 DIAGNOSIS — Y9367 Activity, basketball: Secondary | ICD-10-CM | POA: Insufficient documentation

## 2015-12-03 DIAGNOSIS — S8992XA Unspecified injury of left lower leg, initial encounter: Secondary | ICD-10-CM | POA: Diagnosis present

## 2015-12-03 DIAGNOSIS — W1839XA Other fall on same level, initial encounter: Secondary | ICD-10-CM | POA: Diagnosis not present

## 2015-12-03 DIAGNOSIS — Y999 Unspecified external cause status: Secondary | ICD-10-CM | POA: Insufficient documentation

## 2015-12-03 DIAGNOSIS — Y9289 Other specified places as the place of occurrence of the external cause: Secondary | ICD-10-CM | POA: Diagnosis not present

## 2015-12-03 MED ORDER — IBUPROFEN 400 MG PO TABS
600.0000 mg | ORAL_TABLET | Freq: Once | ORAL | Status: AC
  Administered 2015-12-04: 600 mg via ORAL
  Filled 2015-12-03: qty 1

## 2015-12-03 NOTE — ED Notes (Signed)
MD at bedside. 

## 2015-12-03 NOTE — ED Triage Notes (Signed)
He was running prior to his fall

## 2015-12-03 NOTE — ED Provider Notes (Signed)
[facility] DEPT Provider Note   CSN: [id number] Arrival date & time: 12/03/15  2119 By signing my name below, I, Brandon Henry, attest that this documentation has been prepared under the direction and in the presence of Sherwood Gambler, MD. Electronically Signed: Georgette Henry, ED Scribe. 12/03/15. 11:16 PM.  History   Chief Complaint Chief Complaint  Patient presents with  . Leg Injury   HPI Comments: Brandon Henry is a 30 y.o. male who presents to the Emergency Department complaining of left calf pain with swelling s/p mechanical fall earlier today. Pt states he fell while playing basketball and he felt something give away and it felt as if he "stepped in a hole". No LOC. Pt denies head injury. He rates his pain a 2/10 but notes that it is an 8/10 on palpation of area. Pain is also exacerbated with standing and ambulating. No alleviating factors noted. He denies any additional injuries. Pt denies numbness, paresthesia, or any other associated symptoms.   The history is provided by the patient. No language interpreter was used.    Past Medical History:  Diagnosis Date  . Asthma     Patient Active Problem List   Diagnosis Date Noted  . 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 Leucopenia 01/26/2011    History reviewed. No pertinent surgical history.     Home Medications    Prior to Admission medications   Medication Sig Start Date End Date Taking? Authorizing Provider  Ascorbic Acid (VITAMIN C PO) Take 1 tablet by mouth 2 (two) times daily as needed (for cold symptoms).    Historical Provider, MD  cetirizine (ZYRTEC) 10 MG tablet Take 10 mg by mouth daily.    Historical Provider, MD  FERROUS SULFATE PO Take 1 tablet by mouth every evening.    Historical Provider, MD  HYDROCORTISONE PO Take 1 application by mouth daily.    Historical Provider, MD  Multiple Vitamin (MULTIVITAMIN WITH MINERALS) TABS Take 2 tablets by mouth daily. Adult gummies    Historical Provider, MD     Family History No family history on file.  Social History Social History  Substance Use Topics  . Smoking status: Never Smoker  . Smokeless tobacco: Never Used  . Alcohol use No     Allergies   Review of patient's allergies indicates no known allergies.   Review of Systems Review of Systems  Constitutional: Negative for fever.  Musculoskeletal: Positive for arthralgias.  Neurological: Negative for numbness.  All other systems reviewed and are negative.    Physical Exam Updated Vital Signs BP 114/86   Pulse 108   Temp 98.6 F (37 C) (Oral)   Resp 18   SpO2 100%   Physical Exam  Constitutional: He is oriented to person, place, and time. He appears well-developed and well-nourished.  HENT:  Head: Normocephalic and atraumatic.  Right Ear: External ear normal.  Left Ear: External ear normal.  Nose: Nose normal.  Eyes: Right eye exhibits no discharge. Left eye exhibits no discharge.  Neck: Neck supple.  Cardiovascular: Normal rate, regular rhythm and normal heart sounds.   Pulses:      Dorsalis pedis pulses are 2+ on the left side.  Pulmonary/Chest: Effort normal and breath sounds normal.  Abdominal: Soft. There is no tenderness.  Musculoskeletal: He exhibits no edema.       Left knee: He exhibits normal range of motion. No tenderness found.       Left ankle: No tenderness. Achilles tendon exhibits pain, defect and abnormal Thompson's  test results (no plantar flexion with squeezing of calf).       Left lower leg: He exhibits tenderness and swelling. He exhibits no bony tenderness.       Left foot: There is no tenderness.  Neurological: He is alert and oriented to person, place, and time.  Skin: Skin is warm and dry.  Nursing note and vitals reviewed.    ED Treatments / Results  DIAGNOSTIC STUDIES: Oxygen Saturation is 100% on RA, normal by my interpretation.    COORDINATION OF CARE: 11:15 PM Discussed treatment plan with pt at bedside which includes  orthopedic referral and splint and pt agreed to plan.  Labs (all labs ordered are listed, but only abnormal results are displayed) Labs Reviewed - No data to display  EKG  EKG Interpretation None       Radiology Dg Tibia/fibula Left  Result Date: 12/03/2015 CLINICAL DATA:  Felt something pop at left calf. Pain on attempting to bear weight, at the distal posterior left lower leg. Initial encounter. EXAM: LEFT TIBIA AND FIBULA - 2 VIEW COMPARISON:  None. FINDINGS: There is no evidence of fracture or dislocation. The tibia and fibula appear grossly intact. The ankle mortise is incompletely assessed, but appears grossly unremarkable. A small posterior calcaneal spur is noted. The knee joint is unremarkable in appearance. No knee joint effusion is identified. A fabella is noted. No definite soft tissue abnormalities are characterized on radiograph. IMPRESSION: No evidence of fracture or dislocation. Electronically Signed   By: Garald Balding M.D.   On: 12/03/2015 23:22   Dg Ankle Complete Left  Result Date: 12/03/2015 CLINICAL DATA:  Felt something pop in his left calf while running today. Unable to weight bear without pain in left leg and ankle. Pain is distal posterior left lower leg. EXAM: LEFT ANKLE COMPLETE - 3+ VIEW COMPARISON:  None. FINDINGS: There is no evidence of fracture, dislocation, or joint effusion. There is no evidence of arthropathy or other focal bone abnormality. Soft tissues are unremarkable. Prominent exostosis arising from the medial cuneiform. Achilles calcaneal spur. IMPRESSION: No acute bony abnormalities. Electronically Signed   By: Lucienne Capers M.D.   On: 12/03/2015 23:19    Procedures Procedures (including critical care time)  Medications Ordered in ED Medications - No data to display   Initial Impression / Assessment and Plan / ED Course  I have reviewed the triage vital signs and the nursing notes.  Pertinent labs & imaging results that were available  during my care of the patient were reviewed by me and considered in my medical decision making (see chart for details).  Clinical Course  Comment By Time  Presentation c/w achilles tendon rupture. Consult ortho. NV intact. Declines meds at this time, is comfortable with ibuprofen/tylenol at home Sherwood Gambler, MD 09/22 2318  D/w Dr. Veverly Fells, he will see on 9/26 (Tuesday), posterior splint in plantar flexion. Sherwood Gambler, MD 09/22 2351   Patient advised of the importance of close follow-up with orthopedics. No other apparent injuries. Pain seems controlled with ibuprofen, advised to ice and elevate as well.No weightbearing.  Final Clinical Impressions(s) / ED Diagnoses   Final diagnoses:  Achilles tendon rupture, left, initial encounter   I personally performed the services described in this documentation, which was scribed in my presence. The recorded information has been reviewed and is accurate.   New Prescriptions New Prescriptions   No medications on file     Sherwood Gambler, MD 12/04/15 0630

## 2015-12-03 NOTE — ED Triage Notes (Signed)
The pt is c/o pain in his lt calf after playing basketball he fell felt something give way  Unable to stand or walk without pain.  Swollen calf muscle

## 2015-12-03 NOTE — ED Notes (Signed)
Pt discussed with dr Diana Eves oredered by dr j Tomi Bamberger

## 2015-12-04 NOTE — ED Notes (Signed)
Pt departed in NAD. Escorted in wheelchair by this RN.

## 2015-12-04 NOTE — ED Notes (Signed)
Ortho tech notified about need of splint.

## 2017-03-12 ENCOUNTER — Ambulatory Visit (HOSPITAL_COMMUNITY)
Admission: EM | Admit: 2017-03-12 | Discharge: 2017-03-12 | Disposition: A | Discharge status: Home / Self Care | Payer: PRIVATE HEALTH INSURANCE | Attending: Family Medicine | Admitting: Family Medicine

## 2017-03-12 ENCOUNTER — Encounter (HOSPITAL_COMMUNITY): Payer: Self-pay | Admitting: Family Medicine

## 2017-03-12 DIAGNOSIS — E119 Type 2 diabetes mellitus without complications: Secondary | ICD-10-CM

## 2017-03-12 DIAGNOSIS — R631 Polydipsia: Secondary | ICD-10-CM

## 2017-03-12 DIAGNOSIS — R350 Frequency of micturition: Secondary | ICD-10-CM | POA: Diagnosis not present

## 2017-03-12 LAB — POCT I-STAT, CHEM 8
BUN: 13 mg/dL (ref 6–20)
CALCIUM ION: 1.18 mmol/L (ref 1.15–1.40)
CHLORIDE: 99 mmol/L — AB (ref 101–111)
Creatinine, Ser: 0.8 mg/dL (ref 0.61–1.24)
GLUCOSE: 352 mg/dL — AB (ref 65–99)
HCT: 42 % (ref 39.0–52.0)
Hemoglobin: 14.3 g/dL (ref 13.0–17.0)
Potassium: 3.9 mmol/L (ref 3.5–5.1)
Sodium: 140 mmol/L (ref 135–145)
TCO2: 29 mmol/L (ref 22–32)

## 2017-03-12 LAB — POCT URINALYSIS DIP (DEVICE)
Glucose, UA: 500 mg/dL — AB
HGB URINE DIPSTICK: NEGATIVE
Leukocytes, UA: NEGATIVE
NITRITE: NEGATIVE
PH: 5.5 (ref 5.0–8.0)
Protein, ur: 30 mg/dL — AB
Specific Gravity, Urine: >=1.03 (ref 1.005–1.030)
Urobilinogen, UA: 0.2 mg/dL (ref 0.0–1.0)

## 2017-03-12 LAB — GLUCOSE, CAPILLARY: Glucose-Capillary: 343 mg/dL — ABNORMAL HIGH (ref 65–99)

## 2017-03-12 MED ORDER — METFORMIN HCL 500 MG PO TABS: 500.0000 mg | ORAL_TABLET | Freq: Two times a day (BID) | ORAL | 1 refills | Status: DC

## 2017-03-12 MED ORDER — BLOOD GLUCOSE MONITOR KIT: PACK | 0 refills | Status: AC

## 2017-03-12 NOTE — ED Provider Notes (Signed)
Brandon Henry   [id number] 03/12/17 Arrival Time: 1202  ASSESSMENT & PLAN:  1. Type 2 diabetes mellitus without complication, without long-term current use of insulin ([place])    New diagnosis.  Meds ordered this encounter  Medications  . metFORMIN (GLUCOPHAGE) 500 MG tablet    Sig: Take 1 tablet (500 mg total) by mouth 2 (two) times daily with a meal.    Dispense:  60 tablet    Refill:  1  . blood glucose meter kit and supplies KIT    Sig: Dispense based on patient and insurance preference. Use up to four times daily as directed. (FOR ICD-9 250.00, 250.01).    Dispense:  1 each    Refill:  0    Order Specific Question:   Number of strips    Answer:   100    Order Specific Question:   Number of lancets    Answer:   50   Will begin metformin. He is mainly in [place], [place]. Will seek PCP care there. Discussed importance of getting his BS under control.  Reviewed expectations re: course of current medical issues. Questions answered. Outlined signs and symptoms indicating need for more acute intervention. Patient verbalized understanding. After Visit Summary given.   SUBJECTIVE: History from: patient. Brandon Henry is a 31 y.o. male who presents with complaint of intermittent urinary frequency and significant thirst. Overall fatigue. Reports gradual onset the past month. FH of DM and wonders if he has developed. Appetite normal. Weight stable. No new life changes. No extremity sensation changes or weakness. No specific aggravating or alleviating factors reported regarding current symptoms. No OTC treatment. No hematuria.  ROS: As per HPI.   OBJECTIVE:  Vitals:   03/12/17 1233  BP: 115/72  Pulse: 80  Resp: 18  Temp: 98 F (36.7 C)  SpO2: 100%    General appearance: alert; no distress Eyes: PERRLA; EOMI; conjunctiva normal HENT: normocephalic; atraumatic; TMs normal; nasal mucosa normal; oral mucosa normal Neck: supple  Lungs: clear to auscultation  bilaterally Heart: regular rate and rhythm Abdomen: soft, non-tender; bowel sounds normal; no masses or organomegaly; no guarding or rebound tenderness Back: no CVA tenderness Extremities: no cyanosis or edema; symmetrical with no gross deformities Skin: warm and dry Neurologic: normal gait; normal symmetric reflexes Psychological: alert and cooperative; normal mood and affect  Labs: Results for orders placed or performed during the hospital encounter of 03/12/17  Glucose, capillary  Result Value Ref Range   Glucose-Capillary 343 (H) 65 - 99 mg/dL  POCT urinalysis dip (device)  Result Value Ref Range   Glucose, UA 500 (A) NEGATIVE mg/dL   Bilirubin Urine SMALL (A) NEGATIVE   Ketones, ur >=160 (A) NEGATIVE mg/dL   Specific Gravity, Urine >=1.030 1.005 - 1.030   Hgb urine dipstick NEGATIVE NEGATIVE   pH 5.5 5.0 - 8.0   Protein, ur 30 (A) NEGATIVE mg/dL   Urobilinogen, UA 0.2 0.0 - 1.0 mg/dL   Nitrite NEGATIVE NEGATIVE   Leukocytes, UA NEGATIVE NEGATIVE  I-STAT, chem 8  Result Value Ref Range   Sodium 140 135 - 145 mmol/L   Potassium 3.9 3.5 - 5.1 mmol/L   Chloride 99 (L) 101 - 111 mmol/L   BUN 13 6 - 20 mg/dL   Creatinine, Ser 0.80 0.61 - 1.24 mg/dL   Glucose, Bld 352 (H) 65 - 99 mg/dL   Calcium, Ion 1.18 1.15 - 1.40 mmol/L   TCO2 29 22 - 32 mmol/L   Hemoglobin 14.3 13.0 - 17.0  g/dL   HCT 42.0 39.0 - 52.0 %   Labs Reviewed  GLUCOSE, CAPILLARY - Abnormal; Notable for the following components:      Result Value   Glucose-Capillary 343 (*)    All other components within normal limits  POCT URINALYSIS DIP (DEVICE) - Abnormal; Notable for the following components:   Glucose, UA 500 (*)    Bilirubin Urine SMALL (*)    Ketones, ur >=160 (*)    Protein, ur 30 (*)    All other components within normal limits  POCT I-STAT, CHEM 8 - Abnormal; Notable for the following components:   Chloride 99 (*)    Glucose, Bld 352 (*)    All other components within normal limits    No  Known Allergies  Past Medical History:  Diagnosis Date  . Asthma    Social History   Socioeconomic History  . Marital status: Married    Spouse name: Not on file  . Number of children: Not on file  . Years of education: Not on file  . Highest education level: Not on file  Social Needs  . Financial resource strain: Not on file  . Food insecurity - worry: Not on file  . Food insecurity - inability: Not on file  . Transportation needs - medical: Not on file  . Transportation needs - non-medical: Not on file  Occupational History  . Not on file  Tobacco Use  . Smoking status: Never Smoker  . Smokeless tobacco: Never Used  Substance and Sexual Activity  . Alcohol use: No  . Drug use: No  . Sexual activity: Not on file  Other Topics Concern  . Not on file  Social History Narrative  . Not on file   FH: Diabetes PSH: None   Vanessa Kick, MD 03/14/17 [phone]

## 2017-03-12 NOTE — ED Triage Notes (Addendum)
Pt here for extreme thirst, frequent urination and headaches x 2 weeks. sts recent trip to [place], before all this started and since a lot of traveling. sts took Excedrin and helped the headache. sts vomited one time. sts also yesterday he urinated in his sleep. No hx of diabetes. Family hx of diabetes.

## 2017-03-12 NOTE — Discharge Instructions (Addendum)
Please follow up to have your blood sugar re-checked in 3 days.  Please do your best to find a primary care doctor close to where you will be living.

## 2017-03-14 ENCOUNTER — Encounter (HOSPITAL_COMMUNITY): Payer: Self-pay | Admitting: Family Medicine

## 2017-03-14 ENCOUNTER — Ambulatory Visit (HOSPITAL_COMMUNITY)
Admission: EM | Admit: 2017-03-14 | Discharge: 2017-03-14 | Disposition: A | Discharge status: Home / Self Care | Payer: PRIVATE HEALTH INSURANCE | Attending: Urgent Care | Admitting: Urgent Care

## 2017-03-14 DIAGNOSIS — R824 Acetonuria: Secondary | ICD-10-CM

## 2017-03-14 DIAGNOSIS — E1165 Type 2 diabetes mellitus with hyperglycemia: Secondary | ICD-10-CM

## 2017-03-14 LAB — POCT URINALYSIS DIP (DEVICE)
Bilirubin Urine: NEGATIVE
GLUCOSE, UA: 500 mg/dL — AB
Ketones, ur: 15 mg/dL — AB
Leukocytes, UA: NEGATIVE
Nitrite: NEGATIVE
PH: 6 (ref 5.0–8.0)
PROTEIN: NEGATIVE mg/dL
SPECIFIC GRAVITY, URINE: 1.015 (ref 1.005–1.030)
UROBILINOGEN UA: 0.2 mg/dL (ref 0.0–1.0)

## 2017-03-14 LAB — GLUCOSE, CAPILLARY: GLUCOSE-CAPILLARY: 415 mg/dL — AB (ref 65–99)

## 2017-03-14 MED ORDER — SITAGLIPTIN PHOSPHATE 100 MG PO TABS: 100.0000 mg | ORAL_TABLET | Freq: Every day | ORAL | 1 refills | Status: DC

## 2017-03-14 NOTE — Discharge Instructions (Addendum)
Please set up an appointment with me, Jaynee Eagles, PA-C, a Primary Care at [facility]. Make sure you are fasting for a period of at least 6 hours. Our phone number is [phone].

## 2017-03-14 NOTE — ED Provider Notes (Signed)
  MRN: [id number] DOB: [birth year]  Subjective:   Brandon Henry is a 32 y.o. male presenting for follow up on new onset type 2 diabetes. Patient's last OV was 03/12/2017, had hyperglycemia, ketonuria. He was started on Metformin and advised to follow up in 2 days. Today, reports that urinary frequency, dry mouth has improved. He still has fatigue, malaise. Has some belly cramping with Metformin. Denies headache, confusion, chest pain, shob, belly pain, hematuria. Patient did pick up a glucometer and fasting readings have been in 300's.  No current facility-administered medications for this encounter.   Current Outpatient Medications:  .  blood glucose meter kit and supplies KIT, Dispense based on patient and insurance preference. Use up to four times daily as directed. (FOR ICD-9 250.00, 250.01)., Disp: 1 each, Rfl: 0 .  cetirizine (ZYRTEC) 10 MG tablet, Take 10 mg by mouth daily., Disp: , Rfl:  .  metFORMIN (GLUCOPHAGE) 500 MG tablet, Take 1 tablet (500 mg total) by mouth 2 (two) times daily with a meal., Disp: 60 tablet, Rfl: 1 .  sitaGLIPtin (JANUVIA) 100 MG tablet, Take 1 tablet (100 mg total) by mouth daily., Disp: 90 tablet, Rfl: 1   Brandon Henry has No Known Allergies.  Brandon Henry  has a past medical history of Asthma. Denies past surgical history.    Objective:   Vitals: There were no vitals taken for this visit.  Physical Exam  Constitutional: He is oriented to person, place, and time. He appears well-developed and well-nourished.  Cardiovascular: Normal rate.  Pulmonary/Chest: Effort normal.  Neurological: He is alert and oriented to person, place, and time.  Skin: Skin is warm and dry.  Psychiatric: He has a normal mood and affect.   Results for orders placed or performed during the hospital encounter of 03/14/17 (from the past 24 hour(s))  Glucose, capillary     Status: Abnormal   Collection Time: 03/14/17  2:51 PM  Result Value Ref Range   Glucose-Capillary 415 (H) 65 - 99 mg/dL  POCT  urinalysis dip (device)     Status: Abnormal   Collection Time: 03/14/17  3:16 PM  Result Value Ref Range   Glucose, UA 500 (A) NEGATIVE mg/dL   Bilirubin Urine NEGATIVE NEGATIVE   Ketones, ur 15 (A) NEGATIVE mg/dL   Specific Gravity, Urine 1.015 1.005 - 1.030   Hgb urine dipstick TRACE (A) NEGATIVE   pH 6.0 5.0 - 8.0   Protein, ur NEGATIVE NEGATIVE mg/dL   Urobilinogen, UA 0.2 0.0 - 1.0 mg/dL   Nitrite NEGATIVE NEGATIVE   Leukocytes, UA NEGATIVE NEGATIVE   Assessment and Plan :   Uncontrolled type 2 diabetes mellitus with hyperglycemia (HCC)  Ketonuria   Ketonuria is significantly improved. Patient is largely asymptomatic. Maintain metformin, add sitagliptin. He will plan to set up PCP care with me at Primary [facility] at [facility]. Return-to-clinic precautions discussed, patient verbalized understanding.    Jaynee Eagles, [place] 03/14/17 1535

## 2017-03-14 NOTE — ED Triage Notes (Signed)
Pt here for diabetes follow up. sts that he has been taking his meds and the lowest glucose is 305. sts that he has been watching his diet.

## 2017-03-15 ENCOUNTER — Encounter: Payer: Self-pay | Admitting: Urgent Care

## 2017-03-15 ENCOUNTER — Ambulatory Visit: Payer: Self-pay | Admitting: Urgent Care

## 2017-03-15 VITALS — BP 121/80 | HR 74 | Temp 98.1°F | Resp 18 | Ht 74.0 in | Wt 277.0 lb

## 2017-03-15 DIAGNOSIS — E1165 Type 2 diabetes mellitus with hyperglycemia: Secondary | ICD-10-CM

## 2017-03-15 DIAGNOSIS — E669 Obesity, unspecified: Secondary | ICD-10-CM

## 2017-03-15 DIAGNOSIS — Z6835 Body mass index (BMI) 35.0-35.9, adult: Secondary | ICD-10-CM

## 2017-03-15 LAB — POCT GLYCOSYLATED HEMOGLOBIN (HGB A1C): Hemoglobin A1C: 12.5

## 2017-03-15 MED ORDER — "INSULIN SYRINGE 30G X 5/16"" 1 ML MISC": 5 refills | Status: DC

## 2017-03-15 MED ORDER — INSULIN NPH ISOPHANE & REGULAR (70-30) 100 UNIT/ML ~~LOC~~ SUSP: 5.0000 [IU] | Freq: Two times a day (BID) | SUBCUTANEOUS | 11 refills | Status: DC

## 2017-03-15 MED ORDER — "INSULIN SYRINGE-NEEDLE U-100 30G X 5/16"" 1 ML MISC": 5 refills | Status: DC

## 2017-03-15 NOTE — Patient Instructions (Addendum)
Diabetes Mellitus and Nutrition When you have diabetes (diabetes mellitus), it is very important to have healthy eating habits because your blood sugar (glucose) levels are greatly affected by what you eat and drink. Eating healthy foods in the appropriate amounts, at about the same times every day, can help you:  Control your blood glucose.  Lower your risk of heart disease.  Improve your blood pressure.  Reach or maintain a healthy weight.  Every person with diabetes is different, and each person has different needs for a meal plan. Your health care provider may recommend that you work with a diet and nutrition specialist (dietitian) to make a meal plan that is best for you. Your meal plan may vary depending on factors such as:  The calories you need.  The medicines you take.  Your weight.  Your blood glucose, blood pressure, and cholesterol levels.  Your activity level.  Other health conditions you have, such as heart or kidney disease.  How do carbohydrates affect me? Carbohydrates affect your blood glucose level more than any other type of food. Eating carbohydrates naturally increases the amount of glucose in your blood. Carbohydrate counting is a method for keeping track of how many carbohydrates you eat. Counting carbohydrates is important to keep your blood glucose at a healthy level, especially if you use insulin or take certain oral diabetes medicines. It is important to know how many carbohydrates you can safely have in each meal. This is different for every person. Your dietitian can help you calculate how many carbohydrates you should have at each meal and for snack. Foods that contain carbohydrates include:  Bread, cereal, rice, pasta, and crackers.  Potatoes and corn.  Peas, beans, and lentils.  Milk and yogurt.  Fruit and juice.  Desserts, such as cakes, cookies, ice cream, and candy.  How does alcohol affect me? Alcohol can cause a sudden decrease in blood  glucose (hypoglycemia), especially if you use insulin or take certain oral diabetes medicines. Hypoglycemia can be a life-threatening condition. Symptoms of hypoglycemia (sleepiness, dizziness, and confusion) are similar to symptoms of having too much alcohol. If your health care provider says that alcohol is safe for you, follow these guidelines:  Limit alcohol intake to no more than 1 drink per day for nonpregnant women and 2 drinks per day for men. One drink equals 12 oz of beer, 5 oz of wine, or 1 oz of hard liquor.  Do not drink on an empty stomach.  Keep yourself hydrated with water, diet soda, or unsweetened iced tea.  Keep in mind that regular soda, juice, and other mixers may contain a lot of sugar and must be counted as carbohydrates.  What are tips for following this plan? Reading food labels  Start by checking the serving size on the label. The amount of calories, carbohydrates, fats, and other nutrients listed on the label are based on one serving of the food. Many foods contain more than one serving per package.  Check the total grams (g) of carbohydrates in one serving. You can calculate the number of servings of carbohydrates in one serving by dividing the total carbohydrates by 15. For example, if a food has 30 g of total carbohydrates, it would be equal to 2 servings of carbohydrates.  Check the number of grams (g) of saturated and trans fats in one serving. Choose foods that have low or no amount of these fats.  Check the number of milligrams (mg) of sodium in one serving. Most people   should limit total sodium intake to less than 2,300 mg per day.  Always check the nutrition information of foods labeled as "low-fat" or "nonfat". These foods may be higher in added sugar or refined carbohydrates and should be avoided.  Talk to your dietitian to identify your daily goals for nutrients listed on the label. Shopping  Avoid buying canned, premade, or processed foods. These  foods tend to be high in fat, sodium, and added sugar.  Shop around the outside edge of the grocery store. This includes fresh fruits and vegetables, bulk grains, fresh meats, and fresh dairy. Cooking  Use low-heat cooking methods, such as baking, instead of high-heat cooking methods like deep frying.  Cook using healthy oils, such as olive, canola, or sunflower oil.  Avoid cooking with butter, cream, or high-fat meats. Meal planning  Eat meals and snacks regularly, preferably at the same times every day. Avoid going long periods of time without eating.  Eat foods high in fiber, such as fresh fruits, vegetables, beans, and whole grains. Talk to your dietitian about how many servings of carbohydrates you can eat at each meal.  Eat 4-6 ounces of lean protein each day, such as lean meat, chicken, fish, eggs, or tofu. 1 ounce is equal to 1 ounce of meat, chicken, or fish, 1 egg, or 1/4 cup of tofu.  Eat some foods each day that contain healthy fats, such as avocado, nuts, seeds, and fish. Lifestyle   Check your blood glucose regularly.  Exercise at least 30 minutes 5 or more days each week, or as told by your health care provider.  Take medicines as told by your health care provider.  Do not use any products that contain nicotine or tobacco, such as cigarettes and e-cigarettes. If you need help quitting, ask your health care provider.  Work with a counselor or diabetes educator to identify strategies to manage stress and any emotional and social challenges. What are some questions to ask my health care provider?  Do I need to meet with a diabetes educator?  Do I need to meet with a dietitian?  What number can I call if I have questions?  When are the best times to check my blood glucose? Where to find more information:  American Diabetes Association: diabetes.org/food-and-fitness/food  Academy of Nutrition and Dietetics:  www.eatright.org/resources/health/diseases-and-conditions/diabetes  National Institute of Diabetes and Digestive and Kidney Diseases (NIH): www.niddk.nih.gov/health-information/diabetes/overview/diet-eating-physical-activity Summary  A healthy meal plan will help you control your blood glucose and maintain a healthy lifestyle.  Working with a diet and nutrition specialist (dietitian) can help you make a meal plan that is best for you.  Keep in mind that carbohydrates and alcohol have immediate effects on your blood glucose levels. It is important to count carbohydrates and to use alcohol carefully. This information is not intended to replace advice given to you by your health care provider. Make sure you discuss any questions you have with your health care provider. Document Released: 11/24/2004 Document Revised: 04/03/2016 Document Reviewed: 04/03/2016 Elsevier Interactive Patient Education  2018 Elsevier Inc.     IF you received an x-ray today, you will receive an invoice from [facility]. Please contact [facility] at [phone] with questions or concerns regarding your invoice.   IF you received labwork today, you will receive an invoice from [facility]. Please contact [facility] at [phone] with questions or concerns regarding your invoice.   Our billing staff will not be able to assist you with questions regarding bills from these companies.    You will be contacted with the lab results as soon as they are available. The fastest way to get your results is to activate your My Chart account. Instructions are located on the last page of this paperwork. If you have not heard from us regarding the results in 2 weeks, please contact this office.      

## 2017-03-15 NOTE — Progress Notes (Signed)
   MRN: [id number]  Subjective:   Brandon Henry is a 32 y.o. male who presents for follow up of Type 2 Diabetes Mellitus. Patient is currently managed with metformin, tried to pick up sitagliptin but they did not have this stocked. Patient is checking home blood sugars. Home blood sugar is generally 300's.  Patient reports blurred vision has improved. Has mild polydipsia and mild nausea without vomiting with his metformin. Denies chest pain, abdominal pain, hematuria, polyuria, skin infections, numbness or tingling. Patient is checking their feet daily. Denies foot concerns. Last diabetic eye exam eye exam was 01/2017. Patient is making significant dietary modifications.   Known diabetic complications: none  [place] has a current medication list which includes the following prescription(s): blood glucose meter kit and supplies, cetirizine, metformin, and sitagliptin. Also has No Known Allergies.  [place]  has a past medical history of Asthma. Denies past surgical history.  Immunizations: Patient is not insured, would like to hold off on vaccines until he can afford them.  Objective:   PHYSICAL EXAM BP 121/80   Pulse 74   Temp 98.1 F (36.7 C) (Oral)   Resp 18   Ht _0  (1.88 m)   Wt 277 lb (125.6 kg)   SpO2 97%   BMI 35.56 kg/m   Physical Exam  Constitutional: He is oriented to person, place, and time. He appears well-developed and well-nourished.  Cardiovascular: Normal rate.  Pulmonary/Chest: Effort normal.  Neurological: He is alert and oriented to person, place, and time.  Psychiatric: He has a normal mood and affect.   Diabetic Foot Exam - Simple   Simple Foot Form Diabetic Foot exam was performed with the following findings:  Yes 03/15/2017  3:45 PM  Visual Inspection No deformities, no ulcerations, no other skin breakdown bilaterally:  Yes Sensation Testing Intact to touch and monofilament testing bilaterally:  Yes Pulse Check Posterior Tibialis and Dorsalis pulse intact  bilaterally:  Yes Comments    Results for orders placed or performed in visit on 03/15/17 (from the past 24 hour(s))  POCT glycosylated hemoglobin (Hb A1C)     Status: None   Collection Time: 03/15/17  3:45 PM  Result Value Ref Range   Hemoglobin A1C 12.5    Assessment and Plan :   Uncontrolled type 2 diabetes mellitus with hyperglycemia (HCC) - Plan: POCT glycosylated hemoglobin (Hb A1C), Lipid panel, HM DIABETES FOOT EXAM  Class 2 obesity without serious comorbidity with body mass index (BMI) of 35.0 to 35.9 in adult, unspecified obesity type   Counseled patient on insulin. Recommended that he start intermediate insulin. Maintain metformin. Patient may wait to start sitagliptin given cost. I counseled against using other "left over" insulin his family has. Return-to-clinic precautions discussed, patient verbalized understanding. Labs pending. Follow up in 4 weeks.  Jaynee Eagles, PA-C [facility] [phone] 03/15/2017 3:35 PM

## 2017-03-16 LAB — LIPID PANEL
CHOL/HDL RATIO: 5 ratio (ref 0.0–5.0)
Cholesterol, Total: 210 mg/dL — ABNORMAL HIGH (ref 100–199)
HDL: 42 mg/dL (ref >39)
LDL CALC: 124 mg/dL — AB (ref 0–99)
Triglycerides: 219 mg/dL — ABNORMAL HIGH (ref 0–149)
VLDL CHOLESTEROL CAL: 44 mg/dL — AB (ref 5–40)

## 2017-03-20 ENCOUNTER — Other Ambulatory Visit: Payer: Self-pay | Admitting: Urgent Care

## 2017-03-20 MED ORDER — ATORVASTATIN CALCIUM 20 MG PO TABS: 20.0000 mg | ORAL_TABLET | Freq: Every day | ORAL | 3 refills | Status: DC

## 2017-03-30 ENCOUNTER — Encounter: Payer: Self-pay | Admitting: Urgent Care

## 2017-03-30 ENCOUNTER — Ambulatory Visit (INDEPENDENT_AMBULATORY_CARE_PROVIDER_SITE_OTHER): Payer: Self-pay | Admitting: Urgent Care

## 2017-03-30 VITALS — BP 122/70 | HR 67 | Temp 98.0°F | Resp 16 | Ht 74.0 in | Wt 280.0 lb

## 2017-03-30 DIAGNOSIS — E782 Mixed hyperlipidemia: Secondary | ICD-10-CM

## 2017-03-30 DIAGNOSIS — E669 Obesity, unspecified: Secondary | ICD-10-CM

## 2017-03-30 DIAGNOSIS — E1165 Type 2 diabetes mellitus with hyperglycemia: Secondary | ICD-10-CM

## 2017-03-30 DIAGNOSIS — Z6835 Body mass index (BMI) 35.0-35.9, adult: Secondary | ICD-10-CM

## 2017-03-30 LAB — GLUCOSE, POCT (MANUAL RESULT ENTRY): POC GLUCOSE: 134 mg/dL — AB (ref 70–99)

## 2017-03-30 MED ORDER — LISINOPRIL 2.5 MG PO TABS: 2.5000 mg | ORAL_TABLET | Freq: Every day | ORAL | 3 refills | Status: DC

## 2017-03-30 MED ORDER — METFORMIN HCL 1000 MG PO TABS: 1000.0000 mg | ORAL_TABLET | Freq: Two times a day (BID) | ORAL | 3 refills | Status: DC

## 2017-03-30 NOTE — Patient Instructions (Addendum)
Blood sugar readings If you are persistently below 90 for your fasting blood sugar, then decrease your insulin dose to 3 units twice daily. If your blood sugar readings continue to remain at or below 90 then you can stop insulin. Once you start decreasing insulin then increase metformin to 1,'000mg'$  in the morning with '500mg'$  in the evening for 1 week. Then increase metformin to 1,'000mg'$  twice daily. Any time you see your blood sugar drop below 90, you should try to drink 4 ounces of orange juice.     Hypoglycemia Hypoglycemia occurs when the level of sugar (glucose) in the blood is too low. Glucose is a type of sugar that provides the body's main source of energy. Certain hormones (insulin and glucagon) control the level of glucose in the blood. Insulin lowers blood glucose, and glucagon increases blood glucose. Hypoglycemia can result from having too much insulin in the bloodstream, or from not eating enough food that contains glucose. Hypoglycemia can happen in people who do or do not have diabetes. It can develop quickly, and it can be a medical emergency. What are the causes? Hypoglycemia occurs most often in people who have diabetes. If you have diabetes, hypoglycemia may be caused by:  Diabetes medicine.  Not eating enough, or not eating often enough.  Increased physical activity.  Drinking alcohol, especially when you have not eaten recently.  If you do not have diabetes, hypoglycemia may be caused by:  A tumor in the pancreas. The pancreas is the organ that makes insulin.  Not eating enough, or not eating for long periods at a time (fasting).  Severe infection or illness that affects the liver, heart, or kidneys.  Certain medicines.  You may also have reactive hypoglycemia. This condition causes hypoglycemia within 4 hours of eating a meal. This may occur after having stomach surgery. Sometimes, the cause of reactive hypoglycemia is not known. What increases the risk? Hypoglycemia  is more likely to develop in:  People who have diabetes and take medicines to lower blood glucose.  People who abuse alcohol.  People who have a severe illness.  What are the signs or symptoms? Hypoglycemia may not cause any symptoms. If you have symptoms, they may include:  Hunger.  Anxiety.  Sweating and feeling clammy.  Confusion.  Dizziness or feeling light-headed.  Sleepiness.  Nausea.  Increased heart rate.  Headache.  Blurry vision.  Seizure.  Nightmares.  Tingling or numbness around the mouth, lips, or tongue.  A change in speech.  Decreased ability to concentrate.  A change in coordination.  Restless sleep.  Tremors or shakes.  Fainting.  Irritability.  How is this diagnosed? Hypoglycemia is diagnosed with a blood test to measure your blood glucose level. This blood test is done while you are having symptoms. Your health care provider may also do a physical exam and review your medical history. If you do not have diabetes, other tests may be done to find the cause of your hypoglycemia. How is this treated? This condition can often be treated by immediately eating or drinking something that contains glucose, such as:  3-4 sugar tablets (glucose pills).  Glucose gel, 15-gram tube.  Fruit juice, 4 oz (120 mL).  Regular soda (not diet soda), 4 oz (120 mL).  Low-fat milk, 4 oz (120 mL).  Several pieces of hard candy.  Sugar or honey, 1 Tbsp.  Treating Hypoglycemia If You Have Diabetes  If you are alert and able to swallow safely, follow the 15:15 rule:  Take 15 grams of a rapid-acting carbohydrate. Rapid-acting options include: ? 1 tube of glucose gel. ? 3 glucose pills. ? 6-8 pieces of hard candy. ? 4 oz (120 mL) of fruit juice. ? 4 oz (120 ml) of regular (not diet) soda.  Check your blood glucose 15 minutes after you take the carbohydrate.  If the repeat blood glucose level is still at or below 70 mg/dL (3.9 mmol/L), take 15  grams of a carbohydrate again.  If your blood glucose level does not increase above 70 mg/dL (3.9 mmol/L) after 3 tries, seek emergency medical care.  After your blood glucose level returns to normal, eat a meal or a snack within 1 hour.  Treating Severe Hypoglycemia Severe hypoglycemia is when your blood glucose level is at or below 54 mg/dL (3 mmol/L). Severe hypoglycemia is an emergency. Do not wait to see if the symptoms will go away. Get medical help right away. Call your local emergency services (911 in the [place] Do not drive yourself to the hospital. If you have severe hypoglycemia and you cannot eat or drink, you may need an injection of glucagon. A family member or close friend should learn how to check your blood glucose and how to give you a glucagon injection. Ask your health care provider if you need to have an emergency glucagon injection kit available. Severe hypoglycemia may need to be treated in a hospital. The treatment may include getting glucose through an IV tube. You may also need treatment for the cause of your hypoglycemia. Follow these instructions at home: General instructions  Avoid any diets that cause you to not eat enough food. Talk with your health care provider before you start any new diet.  Take over-the-counter and prescription medicines only as told by your health care provider.  Limit alcohol intake to no more than 1 drink per day for nonpregnant women and 2 drinks per day for men. One drink equals 12 oz of beer, 5 oz of wine, or 1 oz of hard liquor.  Keep all follow-up visits as told by your health care provider. This is important. If You Have Diabetes:   Make sure you know the symptoms of hypoglycemia.  Always have a rapid-acting carbohydrate snack with you to treat low blood sugar.  Follow your diabetes management plan, as told by your health care provider. Make sure you: ? Take your medicines as directed. ? Follow your exercise plan. ? Follow  your meal plan. Eat on time, and do not skip meals. ? Check your blood glucose as often as directed. Make sure to check your blood glucose before and after exercise. If you exercise longer or in a different way than usual, check your blood glucose more often. ? Follow your sick day plan whenever you cannot eat or drink normally. Make this plan in advance with your health care provider.  Share your diabetes management plan with people in your workplace, school, and household.  Check your urine for ketones when you are ill and as told by your health care provider.  Carry a medical alert card or wear medical alert jewelry. If You Have Reactive Hypoglycemia or Low Blood Sugar From Other Causes:  Monitor your blood glucose as told by your health care provider.  Follow instructions from your health care provider about eating or drinking restrictions. Contact a health care provider if:  You have problems keeping your blood glucose in your target range.  You have frequent episodes of hypoglycemia. Get help right away  if:  You continue to have hypoglycemia symptoms after eating or drinking something containing glucose.  Your blood glucose is at or below 54 mg/dL (3 mmol/L).  You have a seizure.  You faint. These symptoms may represent a serious problem that is an emergency. Do not wait to see if the symptoms will go away. Get medical help right away. Call your local emergency services (911 in the [place] Do not drive yourself to the hospital. This information is not intended to replace advice given to you by your health care provider. Make sure you discuss any questions you have with your health care provider. Document Released: 02/27/2005 Document Revised: 08/11/2015 Document Reviewed: 04/02/2015 Elsevier Interactive Patient Education  2018 [employer].    IF you received an x-ray today, you will receive an invoice from [facility]. Please contact [facility] at  [phone] with questions or concerns regarding your invoice.   IF you received labwork today, you will receive an invoice from [facility]. Please contact [facility] at [phone] with questions or concerns regarding your invoice.   Our billing staff will not be able to assist you with questions regarding bills from these companies.  You will be contacted with the lab results as soon as they are available. The fastest way to get your results is to activate your My Chart account. Instructions are located on the last page of this paperwork. If you have not heard from [place] regarding the results in 2 weeks, please contact this office.

## 2017-03-30 NOTE — Progress Notes (Signed)
   MRN: [id number]  Subjective:   Brandon Henry is a 32 y.o. male who presents for follow up of Type 2 Diabetes Mellitus. Patient is currently managed with metformin 581m BID, intermediate insulin NPH at 5 units twice daily. Patient is checking home blood sugars. Home blood sugar is generally down trending, this past week, his readings have been between 100-140's. Patient denies blurred vision, polydipsia, chest pain, nausea, vomiting, abdominal pain, hematuria, polyuria, skin infections, numbness or tingling. Patient has made significant dietary changes. Patient is not exercising. Reports difficulty with erections, loss of interest in sex at times. He is not interested in medical therapy for this currently.  Known diabetic complications: none  [place] which includes the following prescription(s): atorvastatin, blood glucose meter kit and supplies, cetirizine, insulin nph-regular human, insulin syringe 1cc/30gx5/16", insulin syringe-needle u-100, metformin, and sitagliptin. He  has No Known Allergies.  [place] has a past medical history of Asthma. Denies past surgical history.  Objective:   PHYSICAL EXAM BP 122/70   Pulse 67   Temp 98 F (36.7 C) (Oral)   Resp 16   Ht _0  (1.88 m)   Wt 280 lb (127 kg)   SpO2 97%   BMI 35.95 kg/m   Physical Exam  Constitutional: He is oriented to person, place, and time. He appears well-developed and well-nourished.  Cardiovascular: Normal rate.  Pulmonary/Chest: Effort normal.  Neurological: He is alert and oriented to person, place, and time.  Psychiatric: He has a normal mood and affect.   Results for orders placed or performed in visit on 03/30/17 (from the past 24 hour(s))  POCT glucose (manual entry)     Status: Abnormal   Collection Time: 03/30/17 10:04 AM  Result Value Ref Range   POC Glucose 134 (A) 70 - 99 mg/dl   Assessment and Plan :   Uncontrolled type 2 diabetes mellitus with hyperglycemia (HCC) - Plan:  POCT glucose (manual entry)  Mixed hyperlipidemia  Class 2 obesity without serious comorbidity with body mass index (BMI) of 35.0 to 35.9 in adult, unspecified obesity type  Congratulated patient on drastic lifestyle changes. He reports feeling great apart from sexual dysfunction. Will have patient start lisinopril for renal protection. Gave patient parameters for managing his insulin, diabetes. Return-to-clinic precautions discussed, patient verbalized understanding. Otherwise, f/u in 3 months.  MJaynee Eagles PA-C [facility] [phone]/18/2019 9:59 AM

## 2017-04-16 ENCOUNTER — Telehealth: Payer: Self-pay | Admitting: Urgent Care

## 2017-04-16 NOTE — Telephone Encounter (Unsigned)
Copied from [place] [id number]. Topic: Inquiry >> Apr 16, 2017  5:47 PM Neva Seat wrote: Pt needing to discuss new insulin dosage.  Please call pt back asap so pt can keep up correct dosage.

## 2017-04-17 NOTE — Telephone Encounter (Signed)
For now stay at 3 units. And continue checking fasting blood sugars. Not sure what he means by getting sick. If he means sick from his diabetes like nausea, vomiting, chest pain, diarrhea, etc then he should probably have a recheck.

## 2017-04-17 NOTE — Telephone Encounter (Signed)
Pt notified and verbalized understanding.

## 2017-04-17 NOTE — Telephone Encounter (Signed)
Original dose was 5 units of insulin, and 1000 mg of metformin once daily , was told once his glucose was below 90 to decrease by 2 units,Metformin increased to 1000mg  twice daily..  Since getting sick Friday his numbers have increased back to the 100's. Should he increase his insulin back to 5 units or stay at 3 units daily. Please advise

## 2017-04-19 ENCOUNTER — Ambulatory Visit: Payer: Self-pay | Admitting: Urgent Care

## 2017-06-15 ENCOUNTER — Telehealth: Payer: Self-pay | Admitting: Urgent Care

## 2017-06-15 MED ORDER — ATORVASTATIN CALCIUM 20 MG PO TABS: 20.0000 mg | ORAL_TABLET | Freq: Every day | ORAL | 3 refills | Status: DC

## 2017-06-15 NOTE — Telephone Encounter (Signed)
Copied from [place] [id number]. Topic: Quick Communication - Rx Refill/Question >> Jun 15, 2017  9:08 AM Aurelio Brash B wrote: Medication: atorvastatin (LIPITOR) 20 MG tablet   Has the patient contacted their pharmacy? {no he states he has not used this pharmacy for this med,  it is cheaper there  (Agent: If no, request that the patient contact the pharmacy for the refill.)  Preferred Pharmacy (with phone number or street name): [place], [place]. [phone] (Phone) [phone] (Fax)       Agent: Please be advised that RX refills may take up to 3 business days. We ask that you follow-up with your pharmacy.

## 2017-06-15 NOTE — Telephone Encounter (Signed)
atorvastatin refill Last OV: 03/30/17 Has upcoming OV : 07/06/17 Last Refill:03/20/17 #90 3 RF Pharmacy:[facility] [address]   Pt requesting change of pharmacy to [place] on [address]

## 2017-06-29 ENCOUNTER — Ambulatory Visit: Payer: Self-pay | Admitting: Urgent Care

## 2017-07-06 ENCOUNTER — Ambulatory Visit (INDEPENDENT_AMBULATORY_CARE_PROVIDER_SITE_OTHER): Payer: Self-pay | Admitting: Urgent Care

## 2017-07-06 ENCOUNTER — Encounter: Payer: Self-pay | Admitting: Urgent Care

## 2017-07-06 VITALS — BP 114/60 | HR 62 | Temp 98.3°F | Resp 18 | Ht 74.0 in | Wt 276.8 lb

## 2017-07-06 DIAGNOSIS — E119 Type 2 diabetes mellitus without complications: Secondary | ICD-10-CM

## 2017-07-06 DIAGNOSIS — E669 Obesity, unspecified: Secondary | ICD-10-CM

## 2017-07-06 DIAGNOSIS — E782 Mixed hyperlipidemia: Secondary | ICD-10-CM

## 2017-07-06 DIAGNOSIS — Z6835 Body mass index (BMI) 35.0-35.9, adult: Secondary | ICD-10-CM

## 2017-07-06 LAB — POCT GLYCOSYLATED HEMOGLOBIN (HGB A1C): HEMOGLOBIN A1C: 6.2

## 2017-07-06 LAB — GLUCOSE, POCT (MANUAL RESULT ENTRY): POC Glucose: 116 mg/dl — AB (ref 70–99)

## 2017-07-06 MED ORDER — METFORMIN HCL 500 MG PO TABS: 500.0000 mg | ORAL_TABLET | Freq: Two times a day (BID) | ORAL | 1 refills | Status: DC

## 2017-07-06 NOTE — Patient Instructions (Addendum)
Diabetes Mellitus and Nutrition When you have diabetes (diabetes mellitus), it is very important to have healthy eating habits because your blood sugar (glucose) levels are greatly affected by what you eat and drink. Eating healthy foods in the appropriate amounts, at about the same times every day, can help you:  Control your blood glucose.  Lower your risk of heart disease.  Improve your blood pressure.  Reach or maintain a healthy weight.  Every person with diabetes is different, and each person has different needs for a meal plan. Your health care provider may recommend that you work with a diet and nutrition specialist (dietitian) to make a meal plan that is best for you. Your meal plan may vary depending on factors such as:  The calories you need.  The medicines you take.  Your weight.  Your blood glucose, blood pressure, and cholesterol levels.  Your activity level.  Other health conditions you have, such as heart or kidney disease.  How do carbohydrates affect me? Carbohydrates affect your blood glucose level more than any other type of food. Eating carbohydrates naturally increases the amount of glucose in your blood. Carbohydrate counting is a method for keeping track of how many carbohydrates you eat. Counting carbohydrates is important to keep your blood glucose at a healthy level, especially if you use insulin or take certain oral diabetes medicines. It is important to know how many carbohydrates you can safely have in each meal. This is different for every person. Your dietitian can help you calculate how many carbohydrates you should have at each meal and for snack. Foods that contain carbohydrates include:  Bread, cereal, rice, pasta, and crackers.  Potatoes and corn.  Peas, beans, and lentils.  Milk and yogurt.  Fruit and juice.  Desserts, such as cakes, cookies, ice cream, and candy.  How does alcohol affect me? Alcohol can cause a sudden decrease in blood  glucose (hypoglycemia), especially if you use insulin or take certain oral diabetes medicines. Hypoglycemia can be a life-threatening condition. Symptoms of hypoglycemia (sleepiness, dizziness, and confusion) are similar to symptoms of having too much alcohol. If your health care provider says that alcohol is safe for you, follow these guidelines:  Limit alcohol intake to no more than 1 drink per day for nonpregnant women and 2 drinks per day for men. One drink equals 12 oz of beer, 5 oz of wine, or 1 oz of hard liquor.  Do not drink on an empty stomach.  Keep yourself hydrated with water, diet soda, or unsweetened iced tea.  Keep in mind that regular soda, juice, and other mixers may contain a lot of sugar and must be counted as carbohydrates.  What are tips for following this plan? Reading food labels  Start by checking the serving size on the label. The amount of calories, carbohydrates, fats, and other nutrients listed on the label are based on one serving of the food. Many foods contain more than one serving per package.  Check the total grams (g) of carbohydrates in one serving. You can calculate the number of servings of carbohydrates in one serving by dividing the total carbohydrates by 15. For example, if a food has 30 g of total carbohydrates, it would be equal to 2 servings of carbohydrates.  Check the number of grams (g) of saturated and trans fats in one serving. Choose foods that have low or no amount of these fats.  Check the number of milligrams (mg) of sodium in one serving. Most people   should limit total sodium intake to less than 2,300 mg per day.  Always check the nutrition information of foods labeled as "low-fat" or "nonfat". These foods may be higher in added sugar or refined carbohydrates and should be avoided.  Talk to your dietitian to identify your daily goals for nutrients listed on the label. Shopping  Avoid buying canned, premade, or processed foods. These  foods tend to be high in fat, sodium, and added sugar.  Shop around the outside edge of the grocery store. This includes fresh fruits and vegetables, bulk grains, fresh meats, and fresh dairy. Cooking  Use low-heat cooking methods, such as baking, instead of high-heat cooking methods like deep frying.  Cook using healthy oils, such as olive, canola, or sunflower oil.  Avoid cooking with butter, cream, or high-fat meats. Meal planning  Eat meals and snacks regularly, preferably at the same times every day. Avoid going long periods of time without eating.  Eat foods high in fiber, such as fresh fruits, vegetables, beans, and whole grains. Talk to your dietitian about how many servings of carbohydrates you can eat at each meal.  Eat 4-6 ounces of lean protein each day, such as lean meat, chicken, fish, eggs, or tofu. 1 ounce is equal to 1 ounce of meat, chicken, or fish, 1 egg, or 1/4 cup of tofu.  Eat some foods each day that contain healthy fats, such as avocado, nuts, seeds, and fish. Lifestyle   Check your blood glucose regularly.  Exercise at least 30 minutes 5 or more days each week, or as told by your health care provider.  Take medicines as told by your health care provider.  Do not use any products that contain nicotine or tobacco, such as cigarettes and e-cigarettes. If you need help quitting, ask your health care provider.  Work with a counselor or diabetes educator to identify strategies to manage stress and any emotional and social challenges. What are some questions to ask my health care provider?  Do I need to meet with a diabetes educator?  Do I need to meet with a dietitian?  What number can I call if I have questions?  When are the best times to check my blood glucose? Where to find more information:  American Diabetes Association: diabetes.org/food-and-fitness/food  Academy of Nutrition and Dietetics:  www.eatright.org/resources/health/diseases-and-conditions/diabetes  National Institute of Diabetes and Digestive and Kidney Diseases (NIH): www.niddk.nih.gov/health-information/diabetes/overview/diet-eating-physical-activity Summary  A healthy meal plan will help you control your blood glucose and maintain a healthy lifestyle.  Working with a diet and nutrition specialist (dietitian) can help you make a meal plan that is best for you.  Keep in mind that carbohydrates and alcohol have immediate effects on your blood glucose levels. It is important to count carbohydrates and to use alcohol carefully. This information is not intended to replace advice given to you by your health care provider. Make sure you discuss any questions you have with your health care provider. Document Released: 11/24/2004 Document Revised: 04/03/2016 Document Reviewed: 04/03/2016 Elsevier Interactive Patient Education  2018 [employer] Inc.     IF you received an x-ray today, you will receive an invoice from [facility]. Please contact [facility] at [phone] with questions or concerns regarding your invoice.   IF you received labwork today, you will receive an invoice from [facility]. Please contact [facility] at [phone] with questions or concerns regarding your invoice.   Our billing staff will not be able to assist you with questions regarding bills from these companies.    You will be contacted with the lab results as soon as they are available. The fastest way to get your results is to activate your My Chart account. Instructions are located on the last page of this paperwork. If you have not heard from us regarding the results in 2 weeks, please contact this office.      

## 2017-07-06 NOTE — Progress Notes (Signed)
    MRN: [id number] DOB: 09-Jan-1986  Subjective:   Brandon Henry is a 32 y.o. male presenting for follow up on diabetes.  Patient has made significant lifestyle modifications including very healthy diet and exercise.  His blood sugar readings have dramatically dropped.  In January patient saw readings less than 100 and as this persisted patient decided to eat more carbs in the form of fruits.  Patient stopped taking insulin in February.  His highest reading since then has been in the 130s and was a day he forgot to take his medications.  He has since had consistent readings between 90 and 120. Patient denies blurred vision, polydipsia, chest pain, nausea, vomiting, abdominal pain, hematuria, polyuria, skin infections, numbness or tingling.   [place] has a current medication list which includes the following prescription(s): atorvastatin, blood glucose meter kit and supplies, cetirizine, lisinopril, metformin, insulin nph-regular human, insulin syringe 1cc/30gx5/16", and insulin syringe-needle u-100. Also has No Known Allergies.  [place]  has a past medical history of Asthma. denies past surgical history.   Objective:   Vitals: BP 114/60   Pulse 62   Temp 98.3 F (36.8 C) (Oral)   Resp 18   Ht '6\' 2"'$  (1.88 m)   Wt 276 lb 12.8 oz (125.6 kg)   SpO2 98%   BMI 35.54 kg/m   Physical Exam  Constitutional: He is oriented to person, place, and time. He appears well-developed and well-nourished.  Cardiovascular: Normal rate.  Pulmonary/Chest: Effort normal.  Neurological: He is alert and oriented to person, place, and time.    Results for orders placed or performed in visit on 07/06/17 (from the past 24 hour(s))  POCT glucose (manual entry)     Status: Abnormal   Collection Time: 07/06/17  9:29 AM  Result Value Ref Range   POC Glucose 116 (A) 70 - 99 mg/dl  POCT glycosylated hemoglobin (Hb A1C)     Status: None   Collection Time: 07/06/17  9:41 AM  Result Value Ref Range   Hemoglobin A1C 6.2      Assessment and Plan :   Controlled type 2 diabetes mellitus without complication, without long-term current use of insulin ([place]) - Plan: POCT glycosylated hemoglobin (Hb A1C), POCT glucose (manual entry), CANCELED: Comprehensive metabolic panel, CANCELED: Lipid Panel  Class 2 obesity without serious comorbidity with body mass index (BMI) of 35.0 to 35.9 in adult, unspecified obesity type  Mixed hyperlipidemia  Patient is doing very well.  Congratulated him on his dramatic A1c drop.  We will discontinue his insulin from his medications list and decrease his metformin from 1000 mg twice daily to 500 mg twice daily.  Patient is to maintain healthy lifestyle and exercise.  We will follow-up in 6 months.  Maintain all other medications including atorvastatin and lisinopril.  Jaynee Eagles, PA-C [facility] [phone] 07/06/2017  9:25 AM

## 2017-10-23 ENCOUNTER — Telehealth: Payer: Self-pay | Admitting: Urgent Care

## 2017-10-23 NOTE — Telephone Encounter (Signed)
Copied from [place] [id number]. Topic: Referral - Request >> Oct 23, 2017  1:54 PM Mylinda Latina, NT wrote: Reason for CRM: patient called and states he seen Jaynee Eagles and he has mentioned that he should see an eye doctor since he is a Diabetic. Patient states at that time he was not having any issues. But he states now he is having some visual changes and would like a referral to an eye doctor. Please call patient when the referral is placed [phone]

## 2017-10-26 ENCOUNTER — Other Ambulatory Visit: Payer: Self-pay

## 2017-10-26 DIAGNOSIS — E119 Type 2 diabetes mellitus without complications: Secondary | ICD-10-CM

## 2017-10-26 DIAGNOSIS — H539 Unspecified visual disturbance: Secondary | ICD-10-CM

## 2017-10-26 NOTE — Telephone Encounter (Signed)
Brandon Henry,  I put order in for referral to Opthamology for you. OK? Referrals will call pt when they send the referral.

## 2017-10-26 NOTE — Progress Notes (Signed)
ambu

## 2017-11-14 ENCOUNTER — Ambulatory Visit: Payer: Self-pay | Admitting: Urgent Care

## 2017-11-15 ENCOUNTER — Telehealth: Payer: Self-pay | Admitting: Urgent Care

## 2017-11-15 NOTE — Telephone Encounter (Signed)
Called pt and left VM for him to call office and reschedule his appt with Freida Busman that is scheduled 01/04/18. When pt calls back, please reschedule him for : 6 MONTH RECHECK ON HIS DIABETES (NO INSURANCE) SELF PAY (STOP)  Thank you!

## 2017-11-21 ENCOUNTER — Encounter: Payer: Self-pay | Admitting: Urgent Care

## 2017-11-21 LAB — HM DIABETES EYE EXAM

## 2017-12-05 ENCOUNTER — Encounter: Payer: Self-pay | Admitting: Urgent Care

## 2017-12-05 ENCOUNTER — Ambulatory Visit: Payer: Self-pay | Admitting: Urgent Care

## 2017-12-05 ENCOUNTER — Other Ambulatory Visit: Payer: Self-pay

## 2017-12-05 VITALS — BP 117/73 | HR 63 | Temp 98.0°F | Resp 16 | Ht 74.0 in | Wt 286.8 lb

## 2017-12-05 DIAGNOSIS — E119 Type 2 diabetes mellitus without complications: Secondary | ICD-10-CM

## 2017-12-05 DIAGNOSIS — H538 Other visual disturbances: Secondary | ICD-10-CM

## 2017-12-05 LAB — POCT GLYCOSYLATED HEMOGLOBIN (HGB A1C): Hemoglobin A1C: 7 % — AB (ref 4.0–5.6)

## 2017-12-05 MED ORDER — METFORMIN HCL 500 MG PO TABS: 500.0000 mg | ORAL_TABLET | Freq: Two times a day (BID) | ORAL | 1 refills | Status: DC

## 2017-12-05 NOTE — Patient Instructions (Addendum)
For now, lets use 1,000mg  Metformin in the morning, 500mg  at bedtime. If your blood sugar readings drop to 100-110 consistently for 1 month, then you can decrease your metformin back to 500mg  twice daily.     [facility] [place], [place], [place] [address]  [phone]   [facility] Address: [address] [place], [place] [address]  [phone] I'll be starting in October 2019.     If you have lab work done today you will be contacted with your lab results within the next 2 weeks.  If you have not heard from [place] then please contact us. The fastest way to get your results is to register for My Chart.     Diabetes Mellitus and Nutrition When you have diabetes (diabetes mellitus), it is very important to have healthy eating habits because your blood sugar (glucose) levels are greatly affected by what you eat and drink. Eating healthy foods in the appropriate amounts, at about the same times every day, can help you:  Control your blood glucose.  Lower your risk of heart disease.  Improve your blood pressure.  Reach or maintain a healthy weight.  Every person with diabetes is different, and each person has different needs for a meal plan. Your health care provider may recommend that you work with a diet and nutrition specialist (dietitian) to make a meal plan that is best for you. Your meal plan may vary depending on factors such as:  The calories you need.  The medicines you take.  Your weight.  Your blood glucose, blood pressure, and cholesterol levels.  Your activity level.  Other health conditions you have, such as heart or kidney disease.  How do carbohydrates affect me? Carbohydrates affect your blood glucose level more than any other type of food. Eating carbohydrates naturally increases the amount of glucose in your blood. Carbohydrate counting is a method for keeping track of how many carbohydrates you eat. Counting  carbohydrates is important to keep your blood glucose at a healthy level, especially if you use insulin or take certain oral diabetes medicines. It is important to know how many carbohydrates you can safely have in each meal. This is different for every person. Your dietitian can help you calculate how many carbohydrates you should have at each meal and for snack. Foods that contain carbohydrates include:  Bread, cereal, rice, pasta, and crackers.  Potatoes and corn.  Peas, beans, and lentils.  Milk and yogurt.  Fruit and juice.  Desserts, such as cakes, cookies, ice cream, and candy.  How does alcohol affect me? Alcohol can cause a sudden decrease in blood glucose (hypoglycemia), especially if you use insulin or take certain oral diabetes medicines. Hypoglycemia can be a life-threatening condition. Symptoms of hypoglycemia (sleepiness, dizziness, and confusion) are similar to symptoms of having too much alcohol. If your health care provider says that alcohol is safe for you, follow these guidelines:  Limit alcohol intake to no more than 1 drink per day for nonpregnant women and 2 drinks per day for men. One drink equals 12 oz of beer, 5 oz of wine, or 1 oz of hard liquor.  Do not drink on an empty stomach.  Keep yourself hydrated with water, diet soda, or unsweetened iced tea.  Keep in mind that regular soda, juice, and other mixers may contain a lot of sugar and must be counted as carbohydrates.  What are tips for following this plan? Reading food  labels  Start by checking the serving size on the label. The amount of calories, carbohydrates, fats, and other nutrients listed on the label are based on one serving of the food. Many foods contain more than one serving per package.  Check the total grams (g) of carbohydrates in one serving. You can calculate the number of servings of carbohydrates in one serving by dividing the total carbohydrates by 15. For example, if a food has 30 g  of total carbohydrates, it would be equal to 2 servings of carbohydrates.  Check the number of grams (g) of saturated and trans fats in one serving. Choose foods that have low or no amount of these fats.  Check the number of milligrams (mg) of sodium in one serving. Most people should limit total sodium intake to less than 2,300 mg per day.  Always check the nutrition information of foods labeled as "low-fat" or "nonfat". These foods may be higher in added sugar or refined carbohydrates and should be avoided.  Talk to your dietitian to identify your daily goals for nutrients listed on the label. Shopping  Avoid buying canned, premade, or processed foods. These foods tend to be high in fat, sodium, and added sugar.  Shop around the outside edge of the grocery store. This includes fresh fruits and vegetables, bulk grains, fresh meats, and fresh dairy. Cooking  Use low-heat cooking methods, such as baking, instead of high-heat cooking methods like deep frying.  Cook using healthy oils, such as olive, canola, or sunflower oil.  Avoid cooking with butter, cream, or high-fat meats. Meal planning  Eat meals and snacks regularly, preferably at the same times every day. Avoid going long periods of time without eating.  Eat foods high in fiber, such as fresh fruits, vegetables, beans, and whole grains. Talk to your dietitian about how many servings of carbohydrates you can eat at each meal.  Eat 4-6 ounces of lean protein each day, such as lean meat, chicken, fish, eggs, or tofu. 1 ounce is equal to 1 ounce of meat, chicken, or fish, 1 egg, or 1/4 cup of tofu.  Eat some foods each day that contain healthy fats, such as avocado, nuts, seeds, and fish. Lifestyle   Check your blood glucose regularly.  Exercise at least 30 minutes 5 or more days each week, or as told by your health care provider.  Take medicines as told by your health care provider.  Do not use any products that contain  nicotine or tobacco, such as cigarettes and e-cigarettes. If you need help quitting, ask your health care provider.  Work with a Social worker or diabetes educator to identify strategies to manage stress and any emotional and social challenges. What are some questions to ask my health care provider?  Do I need to meet with a diabetes educator?  Do I need to meet with a dietitian?  What number can I call if I have questions?  When are the best times to check my blood glucose? Where to find more information:  American Diabetes Association: diabetes.org/food-and-fitness/food  Academy of Nutrition and Dietetics: PokerClues.dk  [employer] of Diabetes and Digestive and Kidney Diseases (NIH): ContactWire.be Summary  A healthy meal plan will help you control your blood glucose and maintain a healthy lifestyle.  Working with a diet and nutrition specialist (dietitian) can help you make a meal plan that is best for you.  Keep in mind that carbohydrates and alcohol have immediate effects on your blood glucose levels. It is important  to count carbohydrates and to use alcohol carefully. This information is not intended to replace advice given to you by your health care provider. Make sure you discuss any questions you have with your health care provider. Document Released: 11/24/2004 Document Revised: 04/03/2016 Document Reviewed: 04/03/2016 Elsevier Interactive Patient Education  2018 [employer].     IF you received an x-ray today, you will receive an invoice from [facility]. Please contact [facility] at [phone] with questions or concerns regarding your invoice.   IF you received labwork today, you will receive an invoice from [facility]. Please contact [facility] at [phone] with questions or concerns regarding your invoice.   Our billing  staff will not be able to assist you with questions regarding bills from these companies.  You will be contacted with the lab results as soon as they are available. The fastest way to get your results is to activate your My Chart account. Instructions are located on the last page of this paperwork. If you have not heard from [place] regarding the results in 2 weeks, please contact this office.

## 2017-12-05 NOTE — Progress Notes (Signed)
   MRN: [id number]  Subjective:   Brandon Henry is a 32 y.o. male who presents for follow up of Type 2 Diabetes Mellitus. 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 He has had his eyes examined 3 times. Initially when his diabetes was severely uncontrolled, he had double vision/blurred vision which was improved when his diabetes got under control.  He does admit that over the summer he went on a cruise and did not eat as healthfully as he is normally does.  Denies chest pain.  Denies smoking cigarettes.  [place] has a current medication list which includes the following prescription(s): atorvastatin, blood glucose meter kit and supplies, cetirizine, lisinopril, and metformin. Also has No Known Allergies.  [name]  has a past medical history of Asthma. Denies past surgical history.   Objective:   PHYSICAL EXAM BP 117/73 (BP Location: Right Arm, Patient Position: Sitting, Cuff Size: Large)   Pulse 63   Temp 98 F (36.7 C) (Oral)   Resp 16   Ht _0  (1.88 m)   Wt 286 lb 12.8 oz (130.1 kg)   SpO2 98%   BMI 36.82 kg/m   Physical Exam  Constitutional: He is oriented to person, place, and time. He appears well-developed and well-nourished.  Cardiovascular: Normal rate.  Pulmonary/Chest: Effort normal.  Neurological: He is alert and oriented to person, place, and time.   Results for orders placed or performed in visit on 12/05/17 (from the past 24 hour(s))  POCT glycosylated hemoglobin (Hb A1C)     Status: Abnormal   Collection Time: 12/05/17  2:01 PM  Result Value Ref Range   Hemoglobin A1C 7.0 (A) 4.0 - 5.6 %   HbA1c POC (<> result, manual entry)     HbA1c, POC (prediabetic range)     HbA1c, POC (controlled diabetic range)      Assessment and Plan :   Controlled type 2 diabetes mellitus without complication, without long-term current use of insulin ([facility]) - Plan: POCT glycosylated hemoglobin (Hb A1C)  Blurred vision  Doing well. Patient will contact  [facility] for another consult on his blurred vision. Consider referral to neurology thereafter.  Jaynee Eagles, PA-C [facility] [phone] 12/05/2017 2:20 PM

## 2018-01-04 ENCOUNTER — Ambulatory Visit: Payer: Self-pay | Admitting: Urgent Care

## 2018-02-27 ENCOUNTER — Other Ambulatory Visit: Payer: Self-pay | Admitting: Urgent Care

## 2019-01-19 ENCOUNTER — Encounter (HOSPITAL_COMMUNITY): Payer: Self-pay | Admitting: Emergency Medicine

## 2019-01-19 ENCOUNTER — Inpatient Hospital Stay (HOSPITAL_COMMUNITY)
Admission: EM | Admit: 2019-01-19 | Discharge: 2019-02-11 | DRG: 025 | Disposition: E | Payer: PRIVATE HEALTH INSURANCE | Attending: Neurological Surgery | Admitting: Neurological Surgery

## 2019-01-19 ENCOUNTER — Other Ambulatory Visit: Payer: Self-pay

## 2019-01-19 DIAGNOSIS — I615 Nontraumatic intracerebral hemorrhage, intraventricular: Secondary | ICD-10-CM | POA: Diagnosis not present

## 2019-01-19 DIAGNOSIS — G8194 Hemiplegia, unspecified affecting left nondominant side: Secondary | ICD-10-CM | POA: Diagnosis not present

## 2019-01-19 DIAGNOSIS — R4020 Unspecified coma: Secondary | ICD-10-CM | POA: Diagnosis not present

## 2019-01-19 DIAGNOSIS — E669 Obesity, unspecified: Secondary | ICD-10-CM | POA: Diagnosis present

## 2019-01-19 DIAGNOSIS — E232 Diabetes insipidus: Secondary | ICD-10-CM | POA: Diagnosis not present

## 2019-01-19 DIAGNOSIS — D332 Benign neoplasm of brain, unspecified: Secondary | ICD-10-CM

## 2019-01-19 DIAGNOSIS — Z66 Do not resuscitate: Secondary | ICD-10-CM | POA: Diagnosis not present

## 2019-01-19 DIAGNOSIS — H5704 Mydriasis: Secondary | ICD-10-CM | POA: Diagnosis not present

## 2019-01-19 DIAGNOSIS — G935 Compression of brain: Secondary | ICD-10-CM | POA: Diagnosis not present

## 2019-01-19 DIAGNOSIS — Z20828 Contact with and (suspected) exposure to other viral communicable diseases: Secondary | ICD-10-CM | POA: Diagnosis present

## 2019-01-19 DIAGNOSIS — R569 Unspecified convulsions: Secondary | ICD-10-CM | POA: Diagnosis present

## 2019-01-19 DIAGNOSIS — Z7984 Long term (current) use of oral hypoglycemic drugs: Secondary | ICD-10-CM

## 2019-01-19 DIAGNOSIS — J45909 Unspecified asthma, uncomplicated: Secondary | ICD-10-CM | POA: Diagnosis present

## 2019-01-19 DIAGNOSIS — H547 Unspecified visual loss: Secondary | ICD-10-CM | POA: Diagnosis present

## 2019-01-19 DIAGNOSIS — D496 Neoplasm of unspecified behavior of brain: Secondary | ICD-10-CM | POA: Diagnosis present

## 2019-01-19 DIAGNOSIS — Z79899 Other long term (current) drug therapy: Secondary | ICD-10-CM

## 2019-01-19 DIAGNOSIS — Z6836 Body mass index (BMI) 36.0-36.9, adult: Secondary | ICD-10-CM

## 2019-01-19 DIAGNOSIS — J9600 Acute respiratory failure, unspecified whether with hypoxia or hypercapnia: Secondary | ICD-10-CM | POA: Diagnosis not present

## 2019-01-19 DIAGNOSIS — G936 Cerebral edema: Secondary | ICD-10-CM | POA: Diagnosis not present

## 2019-01-19 DIAGNOSIS — D329 Benign neoplasm of meninges, unspecified: Principal | ICD-10-CM | POA: Diagnosis present

## 2019-01-19 DIAGNOSIS — E1165 Type 2 diabetes mellitus with hyperglycemia: Secondary | ICD-10-CM | POA: Diagnosis present

## 2019-01-19 DIAGNOSIS — T17910A Gastric contents in respiratory tract, part unspecified causing asphyxiation, initial encounter: Secondary | ICD-10-CM

## 2019-01-19 DIAGNOSIS — Z4659 Encounter for fitting and adjustment of other gastrointestinal appliance and device: Secondary | ICD-10-CM

## 2019-01-19 DIAGNOSIS — G9389 Other specified disorders of brain: Secondary | ICD-10-CM

## 2019-01-19 DIAGNOSIS — Z515 Encounter for palliative care: Secondary | ICD-10-CM | POA: Diagnosis present

## 2019-01-19 HISTORY — DX: Type 2 diabetes mellitus without complications: E11.9

## 2019-01-19 LAB — BASIC METABOLIC PANEL
Anion gap: 12 (ref 5–15)
BUN: 17 mg/dL (ref 6–20)
CO2: 26 mmol/L (ref 22–32)
Calcium: 9.8 mg/dL (ref 8.9–10.3)
Chloride: 99 mmol/L (ref 98–111)
Creatinine, Ser: 0.9 mg/dL (ref 0.61–1.24)
GFR calc Af Amer: >60 mL/min (ref >60)
GFR calc non Af Amer: >60 mL/min (ref >60)
Glucose, Bld: 143 mg/dL — ABNORMAL HIGH (ref 70–99)
Potassium: 4.2 mmol/L (ref 3.5–5.1)
Sodium: 137 mmol/L (ref 135–145)

## 2019-01-19 LAB — CBC
HCT: 43.1 % (ref 39.0–52.0)
Hemoglobin: 14.7 g/dL (ref 13.0–17.0)
MCH: 31.6 pg (ref 26.0–34.0)
MCHC: 34.1 g/dL (ref 30.0–36.0)
MCV: 92.7 fL (ref 80.0–100.0)
Platelets: 257 10*3/uL (ref 150–400)
RBC: 4.65 MIL/uL (ref 4.22–5.81)
RDW: 12.3 % (ref 11.5–15.5)
WBC: 5.6 10*3/uL (ref 4.0–10.5)
nRBC: 0 % (ref 0.0–0.2)

## 2019-01-19 LAB — CBG MONITORING, ED: Glucose-Capillary: 140 mg/dL — ABNORMAL HIGH (ref 70–99)

## 2019-01-19 NOTE — ED Triage Notes (Signed)
Pt states he was off his insulin x 2 months, started back 1-2 months ago. Pt states he is having spells of lightheadedness, worse with standing. Denies pain, denies n/v/d.

## 2019-01-20 ENCOUNTER — Emergency Department (HOSPITAL_COMMUNITY): Payer: Self-pay

## 2019-01-20 DIAGNOSIS — G9389 Other specified disorders of brain: Secondary | ICD-10-CM

## 2019-01-20 LAB — CBG MONITORING, ED
Glucose-Capillary: 179 mg/dL — ABNORMAL HIGH (ref 70–99)
Glucose-Capillary: 121 mg/dL — ABNORMAL HIGH (ref 70–99)

## 2019-01-20 LAB — TSH: TSH: 1.006 u[IU]/mL (ref 0.350–4.500)

## 2019-01-20 LAB — CORTISOL-AM, BLOOD: Cortisol - AM: 5 ug/dL — ABNORMAL LOW (ref 6.7–22.6)

## 2019-01-20 LAB — SARS CORONAVIRUS 2 (TAT 6-24 HRS): SARS Coronavirus 2: NEGATIVE

## 2019-01-20 LAB — ETHANOL: Alcohol, Ethyl (B): <10 mg/dL (ref <10)

## 2019-01-20 LAB — HIV ANTIBODY (ROUTINE TESTING W REFLEX): HIV Screen 4th Generation wRfx: NONREACTIVE

## 2019-01-20 MED ORDER — LEVETIRACETAM IN NACL 1000 MG/100ML IV SOLN
1000.0000 mg | Freq: Once | INTRAVENOUS | Status: AC
  Administered 2019-01-20: 02:00:00 1000 mg via INTRAVENOUS
  Filled 2019-01-20: qty 100

## 2019-01-20 MED ORDER — SENNA 8.6 MG PO TABS
1.0000 | ORAL_TABLET | Freq: Two times a day (BID) | ORAL | Status: DC
  Administered 2019-01-20 – 2019-01-22 (×4): 8.6 mg via ORAL
  Filled 2019-01-20 (×5): qty 1

## 2019-01-20 MED ORDER — LORAZEPAM 2 MG/ML IJ SOLN
INTRAMUSCULAR | Status: AC
  Administered 2019-01-20: 01:00:00 1 mg via INTRAVENOUS
  Filled 2019-01-20: qty 1

## 2019-01-20 MED ORDER — LORATADINE 10 MG PO TABS
10.0000 mg | ORAL_TABLET | Freq: Every day | ORAL | Status: DC
  Administered 2019-01-20 – 2019-01-22 (×3): 10 mg via ORAL
  Filled 2019-01-20 (×3): qty 1

## 2019-01-20 MED ORDER — LORAZEPAM 2 MG/ML IJ SOLN
1.0000 mg | Freq: Once | INTRAMUSCULAR | Status: AC
  Administered 2019-01-20: 01:00:00 1 mg via INTRAVENOUS

## 2019-01-20 MED ORDER — ACETAMINOPHEN 325 MG PO TABS: 650.0000 mg | ORAL_TABLET | Freq: Four times a day (QID) | ORAL | Status: DC | PRN

## 2019-01-20 MED ORDER — ONDANSETRON HCL 4 MG/2ML IJ SOLN: 4.0000 mg | Freq: Four times a day (QID) | INTRAMUSCULAR | Status: DC | PRN

## 2019-01-20 MED ORDER — DOCUSATE SODIUM 100 MG PO CAPS
100.0000 mg | ORAL_CAPSULE | Freq: Two times a day (BID) | ORAL | Status: DC
  Administered 2019-01-20 – 2019-01-22 (×5): 100 mg via ORAL
  Filled 2019-01-20 (×6): qty 1

## 2019-01-20 MED ORDER — LEVETIRACETAM IN NACL 500 MG/100ML IV SOLN
500.0000 mg | Freq: Two times a day (BID) | INTRAVENOUS | Status: DC
  Administered 2019-01-20 (×3): 500 mg via INTRAVENOUS
  Filled 2019-01-20 (×4): qty 100

## 2019-01-20 MED ORDER — METFORMIN HCL 500 MG PO TABS
500.0000 mg | ORAL_TABLET | Freq: Two times a day (BID) | ORAL | Status: DC
  Administered 2019-01-20 – 2019-01-22 (×5): 500 mg via ORAL
  Filled 2019-01-20 (×5): qty 1

## 2019-01-20 MED ORDER — HYDROCODONE-ACETAMINOPHEN 5-325 MG PO TABS
1.0000 | ORAL_TABLET | ORAL | Status: DC | PRN
  Administered 2019-01-24: 12:00:00 2 via ORAL
  Filled 2019-01-20 (×2): qty 2

## 2019-01-20 MED ORDER — ONDANSETRON HCL 4 MG PO TABS: 4.0000 mg | ORAL_TABLET | Freq: Four times a day (QID) | ORAL | Status: DC | PRN

## 2019-01-20 MED ORDER — ACETAMINOPHEN 650 MG RE SUPP: 650.0000 mg | Freq: Four times a day (QID) | RECTAL | Status: DC | PRN

## 2019-01-20 MED ORDER — SODIUM CHLORIDE 0.9 % IV SOLN
INTRAVENOUS | Status: DC
  Administered 2019-01-20 – 2019-01-24 (×5): via INTRAVENOUS

## 2019-01-20 MED ORDER — GADOBUTROL 1 MMOL/ML IV SOLN
10.0000 mL | Freq: Once | INTRAVENOUS | Status: AC | PRN
  Administered 2019-01-20: 10 mL via INTRAVENOUS

## 2019-01-20 MED ORDER — LISINOPRIL 5 MG PO TABS
2.5000 mg | ORAL_TABLET | Freq: Every day | ORAL | Status: DC
  Administered 2019-01-20 – 2019-01-22 (×3): 2.5 mg via ORAL
  Filled 2019-01-20 (×3): qty 1

## 2019-01-20 MED ORDER — ATORVASTATIN CALCIUM 10 MG PO TABS
20.0000 mg | ORAL_TABLET | Freq: Every day | ORAL | Status: DC
  Administered 2019-01-20 – 2019-01-22 (×3): 20 mg via ORAL
  Filled 2019-01-20 (×3): qty 2

## 2019-01-20 NOTE — ED Notes (Signed)
Patient transported to MRI 

## 2019-01-20 NOTE — ED Notes (Signed)
Pt was in lobby and had a seizure with family member and lowered to the floor. Pt seized for about 1 min. Pt had some tongue trauma.

## 2019-01-20 NOTE — ED Notes (Signed)
Lunch tray ordered for pt.

## 2019-01-20 NOTE — ED Notes (Signed)
Neuro surgery at bedside.

## 2019-01-20 NOTE — ED Provider Notes (Signed)
[facility] EMERGENCY DEPARTMENT Provider Note   CSN: [id number] Arrival date & time: 02/07/2019  2110     History   Chief Complaint Chief Complaint  Patient presents with  . Dizziness   Level 5 caveat due to altered mental status HPI Seanpaul Preece is a 33 y.o. male.     The history is provided by the patient and the spouse. The history is limited by the condition of the patient.  Seizures Seizure activity on arrival: yes   Seizure type:  Grand mal Postictal symptoms: confusion   Return to baseline: no   Severity:  Severe History of seizures: no   Patient with history of asthma and diabetes.  Wife gives most of the history.  She reports the patient has been having decreasing vision over the past year.  He also reports that has been having intermittent headaches.  She reports he has had evaluations here in [place] for his vision has been told that it is not his eyes.  It was thought that it may be related to his diabetes.  It was advised that he have a second opinion.  Patient was recently in [facility] visiting family and decided to check into an optometrist there the optometrist in [place] stated that he may have a pituitary tumor and advised he needs to have neuroimaging (01/17/2019) He then traveled back to [place]  Wife reports earlier in the evening patient began acting confused and altered.  This occurred around dinnertime.  She brought him to an urgent care that was closed and then brought into this hospital  While waiting in the waiting room wife reports she began acting strange and then fell to the floor and had a seizure.  The seizure lasted for about a minute and is since terminated.  Patient has no known history of seizure Past Medical History:  Diagnosis Date  . Asthma   . Diabetes mellitus without complication [facility])     Patient Active Problem List   Diagnosis Date Noted  . 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 Leucopenia  01/26/2011    History reviewed. No pertinent surgical history.      Home Medications    Prior to Admission medications   Medication Sig Start Date End Date Taking? Authorizing Provider  atorvastatin (LIPITOR) 20 MG tablet Take 1 tablet (20 mg total) by mouth daily. 06/15/17   Jaynee Eagles, PA-C  blood glucose meter kit and supplies KIT Dispense based on patient and insurance preference. Use up to four times daily as directed. (FOR ICD-9 250.00, 250.01). 03/12/17   Vanessa Kick, MD  cetirizine (ZYRTEC) 10 MG tablet Take 10 mg by mouth daily.    [provider]  lisinopril (PRINIVIL,ZESTRIL) 2.5 MG tablet TAKE 1 TABLET BY MOUTH ONCE DAILY 02/28/18   Rutherford Guys, MD  metFORMIN (GLUCOPHAGE) 500 MG tablet Take 1 tablet (500 mg total) by mouth 2 (two) times daily with a meal. 12/05/17   Jaynee Eagles, PA-C    Family History No family history on file.  Social History Social History   Tobacco Use  . Smoking status: Never Smoker  . Smokeless tobacco: Never Used  Substance Use Topics  . Alcohol use: No  . Drug use: No     Allergies   Patient has no known allergies.   Review of Systems Review of Systems  Unable to perform ROS: Mental status change  Neurological: Positive for seizures.     Physical Exam Updated Vital Signs BP 121/84 (BP  Location: Left Arm)   Pulse 95   Temp 99.5 F (37.5 C) (Oral)   Resp 20   Ht 1.88 m (_0 )   Wt 130 kg   SpO2 94%   BMI 36.80 kg/m   Physical Exam CONSTITUTIONAL: Well developed, but appears confused HEAD: Normocephalic/atraumatic, no signs of trauma EYES: EOMI/PERRL, no nystagmus ENMT: Mucous membranes moist NECK: supple no meningeal signs SPINE/BACK: No bruising/crepitance/stepoffs noted to spine CV: S1/S2 noted, no murmurs/rubs/gallops noted LUNGS: Lungs are clear to auscultation bilaterally, no apparent distress ABDOMEN: soft, nontender, nondistended GU:no cva tenderness NEURO: Pt is awake/alert but he appears  confused.  He is intermittently agitated.  He moves all extremities x4 EXTREMITIES: pulses normal/equal, full ROM, no signs of trauma or deformities SKIN: warm, color normal PSYCH: Unable to assess  ED Treatments / Results  Labs (all labs ordered are listed, but only abnormal results are displayed) Labs Reviewed  BASIC METABOLIC PANEL - Abnormal; Notable for the following components:      Result Value   Glucose, Bld 143 (*)    All other components within normal limits  CBG MONITORING, ED - Abnormal; Notable for the following components:   Glucose-Capillary 140 (*)    All other components within normal limits  CBG MONITORING, ED - Abnormal; Notable for the following components:   Glucose-Capillary 179 (*)    All other components within normal limits  SARS CORONAVIRUS 2 (TAT 6-24 HRS)  CBC  ETHANOL    EKG EKG Interpretation  Date/Time:  Monday January 20 2019 01:43:12 EST Ventricular Rate:  113 PR Interval:    QRS Duration: 91 QT Interval:  348 QTC Calculation: 478 R Axis:   90 Text Interpretation: Sinus tachycardia Probable left atrial enlargement Borderline right axis deviation Borderline prolonged QT interval No previous ECGs available Confirmed by Ripley Fraise [phone]) on 01/20/2019 1:51:35 AM   Radiology Ct Head Wo Contrast  Result Date: 01/20/2019 CLINICAL DATA:  New onset seizure EXAM: CT HEAD WITHOUT CONTRAST TECHNIQUE: Contiguous axial images were obtained from the base of the skull through the vertex without intravenous contrast. COMPARISON:  None. FINDINGS: Brain: There is intracranial mass of the anterior cranial fossa suprasellar region that measures 4.6 x 3.0 x 2.6 cm. There is a large area of hypoattenuation adjacent to the frontal horn of the left lateral ventricle and at the inferior right frontal lobe adjacent to the mass. There is no intracranial hemorrhage. There is crowding of the basal cisterns, but they remain patent. Vascular: No abnormal hyperdensity  of the major intracranial arteries or dural venous sinuses. No intracranial atherosclerosis. Skull: The visualized skull base, calvarium and extracranial soft tissues are normal. Sinuses/Orbits: No fluid levels or advanced mucosal thickening of the visualized paranasal sinuses. No mastoid or middle ear effusion. The orbits are normal. IMPRESSION: 1. Large mass of the inferior frontal lobe/anterior cranial fossa. It is not entirely clear on this study whether the mass is intra or extra-axial. MRI of the brain with and without contrast is recommended. 2. Large area of hypoattenuation adjacent to the frontal horn of the left lateral ventricle and at the inferior right frontal lobe adjacent to the mass. The periventricular findings could be secondary to a dilated ventricle filled with proteinaceous debris. However, both areas may indicate cytotoxic edema in the setting of acute ischemia. 3. No intracranial hemorrhage. Critical Value/emergent results were called by telephone at the time of interpretation on 01/20/2019 at 2:00 am to [place] , who verbally acknowledged these results. Electronically Signed  By: Ulyses Jarred M.D.   On: 01/20/2019 02:00   Mr Brain W And Wo Contrast  Result Date: 01/20/2019 CLINICAL DATA:  Abnormal CT scan.  Intracranial mass. EXAM: MRI HEAD WITHOUT AND WITH CONTRAST TECHNIQUE: Multiplanar, multiecho pulse sequences of the brain and surrounding structures were obtained without and with intravenous contrast. CONTRAST:  27m GADAVIST GADOBUTROL 1 MMOL/ML IV SOLN COMPARISON:  CT head without contrast 01/20/2019 FINDINGS: Brain: A somewhat heterogeneous mass lesion is centered at the level of the foramen of Monro. There is extension into the sella and suprasellar regions. Central portions of the mass enhance homogeneously. There are peripheral cystic components. The lesion measures 5.6 x 4.8 x 4.5 cm. Pituitary gland is not visualized separately. The frontal horn of the left  lateral ventricle is asymmetrically dilated. A small cavum septum pellucidum is dilated anteriorly is well. There is some edema in the inferior frontal lobes bilaterally. The tumor surrounds cavernous internal carotid arteries and proximal ACA and MCA vessels without occlusion. There is no invasion into the bone. No other lesions are present. No significant white matter disease is present. No significant extraaxial fluid collection is present. The brainstem and cerebellum are otherwise within normal limits. The internal auditory canals are normal. No other significant enhancement is present. Vascular: Flow is present in the major intracranial arteries. Skull and upper cervical spine: Craniocervical junction is otherwise within normal limits. Sinuses/Orbits: The globes and orbits are within normal limits. Posterior left ethmoid air cells opacified. The paranasal sinuses and mastoid air cells are otherwise clear. IMPRESSION: 1. Central sellar and suprasellar mass lesion with peripheral cystic components measuring 5.6 x 4.8 x 4.5 cm. 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 The lesion does extend into the sella and craniopharyngioma is considered. Although there is no definite dural tail, meningioma is also considered. The absence of additional lesions makes metastatic disease less likely. 2. Dilated frontal horn of the left lateral ventricle is secondary to partial obstruction. 3. Mild edema in the anterior inferior frontal lobes. No other focal areas of edema. Electronically Signed   By: CSan MorelleM.D.   On: 01/20/2019 04:41    Procedures .Critical Care Performed by: WRipley Fraise MD Authorized by: WRipley Fraise MD   Critical care provider statement:    Critical care time (minutes):  60   Critical care start time:  01/20/2019 2:00 AM   Critical care end time:  01/20/2019 3:00 AM   Critical care time was exclusive of:  Separately billable procedures and treating other  patients   Critical care was necessary to treat or prevent imminent or life-threatening deterioration of the following conditions:  CNS failure or compromise   Critical care was time spent personally by me on the following activities:  Pulse oximetry, ordering and review of radiographic studies, ordering and review of laboratory studies, re-evaluation of patient's condition, development of treatment plan with patient or surrogate, discussions with consultants, evaluation of patient's response to treatment, examination of patient and ordering and performing treatments and interventions   I assumed direction of critical care for this patient from another provider in my specialty: no       Medications Ordered in ED Medications  LORazepam (ATIVAN) injection 1 mg (1 mg Intravenous Given 01/20/19 0121)  levETIRAcetam (KEPPRA) IVPB 1000 mg/100 mL premix (0 mg Intravenous Stopped 01/20/19 0253)  gadobutrol (GADAVIST) 1 MMOL/ML injection 10 mL (10 mLs Intravenous Contrast Given 01/20/19 0352)     Initial Impression / Assessment and Plan / ED  Course  I have reviewed the triage vital signs and the nursing notes.  Pertinent labs & imaging results that were available during my care of the patient were reviewed by me and considered in my medical decision making (see chart for details).        1:51 AM Patient presents for decreasing vision for the past year, that began having altered mental status at home and had a seizure while in the ER waiting room.  Patient has been given Ativan, and I will loaded with Keppra 1 g.  I am concerned the patient may have a mass lesion causing the seizure activity. 2:20 AM CT imaging confirms large mass lesion.  After discussion with radiology they recommend MRI brain with and without contrast I discussed the case with on-call neurosurgery who will evaluate the case.  I informed the patient and his wife about the CT findings and need for MRI, they are agreeable with plan.   Patient is now more alert, and can carry on a conversation. 5:37 AM Discussed findings with neurosurgery, they will admit the patient.  He will likely require biopsy first.  Discussed the case at length with patient's mother.  Patient is sleeping at this time, but easily arousable in no acute distress. 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

## 2019-01-20 NOTE — ED Notes (Signed)
ED TO INPATIENT HANDOFF REPORT  ED Nurse Name and Phone #: Lennette Bihari [phone]  S Name/Age/Gender Retta Mac 33 y.o. male Room/Bed: TRABC/TRABC  Code Status   Code Status: Full Code  Home/SNF/Other Home Patient oriented to: [facility] Is this baseline? Yes   Triage Complete: Triage complete  Chief Complaint type 2 diabetic, vision impaired, hallucination  Triage Note Pt states he was off his insulin x 2 months, started back 1-2 months ago. Pt states he is having spells of lightheadedness, worse with standing. Denies pain, denies n/v/d.    Allergies No Known Allergies  Level of Care/Admitting Diagnosis ED Disposition    ED Disposition Condition [facility]: [place] [[id number]]  Level of Care: Progressive [102]  Admit to Progressive based on following criteria: NEUROLOGICAL AND NEUROSURGICAL complex patients with significant risk of instability, who do not meet ICU criteria, yet require close observation or frequent assessment (< / = every 2 - 4 hours) with medical / nursing intervention.  Covid Evaluation: Asymptomatic Screening Protocol (No Symptoms)  Diagnosis: Brain mass [id number]  Admitting Physician: Roxana Hires  Attending Physician: Judith Part [id number]  Estimated length of stay: 3 - 4 days  Certification:: I certify there are rare and unusual circumstances requiring inpatient admission  Bed request comments: 4np  PT Class (Do Not Modify): Inpatient [101]  PT Acc Code (Do Not Modify): Private [1]       B Medical/Surgery History Past Medical History:  Diagnosis Date  . Asthma   . Diabetes mellitus without complication ([place])    History reviewed. No pertinent surgical history.   A IV Location/Drains/Wounds Patient Lines/Drains/Airways Status   Active Line/Drains/Airways    Name:   Placement date:   Placement time:   Site:   Days:   Peripheral IV 01/20/19 Left;Upper Arm   01/20/19    0208    Arm   less than 1           Intake/Output Last 24 hours No intake or output data in the 24 hours ending 01/20/19 0854  Labs/Imaging Results for orders placed or performed during the hospital encounter of 01/22/2019 (from the past 48 hour(s))  CBG monitoring, ED     Status: Abnormal   Collection Time: 01/28/2019  9:57 PM  Result Value Ref Range   Glucose-Capillary 140 (H) 70 - 99 mg/dL  Basic metabolic panel     Status: Abnormal   Collection Time: 02/04/2019 10:12 PM  Result Value Ref Range   Sodium 137 135 - 145 mmol/L   Potassium 4.2 3.5 - 5.1 mmol/L   Chloride 99 98 - 111 mmol/L   CO2 26 22 - 32 mmol/L   Glucose, Bld 143 (H) 70 - 99 mg/dL   BUN 17 6 - 20 mg/dL   Creatinine, Ser 0.90 0.61 - 1.24 mg/dL   Calcium 9.8 8.9 - 10.3 mg/dL   GFR calc non Af Amer >60 >60 mL/min   GFR calc Af Amer >60 >60 mL/min   Anion gap 12 5 - 15    Comment: Performed at [facility], [place] [address] [place] [address]  CBC     Status: None   Collection Time: 01/18/2019 10:12 PM  Result Value Ref Range   WBC 5.6 4.0 - 10.5 K/uL   RBC 4.65 4.22 - 5.81 MIL/uL   Hemoglobin 14.7 13.0 - 17.0 g/dL   HCT 43.1 39.0 - 52.0 %   MCV 92.7 80.0 -  100.0 fL   MCH 31.6 26.0 - 34.0 pg   MCHC 34.1 30.0 - 36.0 g/dL   RDW 12.3 11.5 - 15.5 %   Platelets 257 150 - 400 K/uL   nRBC 0.0 0.0 - 0.2 %    Comment: Performed at [facility], [place] [address], [place], [place] [address]  CBG monitoring, ED     Status: Abnormal   Collection Time: 01/20/19  1:45 AM  Result Value Ref Range   Glucose-Capillary 179 (H) 70 - 99 mg/dL  Ethanol     Status: None   Collection Time: 01/20/19  1:55 AM  Result Value Ref Range   Alcohol, Ethyl (B) <10 <10 mg/dL    Comment: (NOTE) Lowest detectable limit for serum alcohol is 10 mg/dL. For medical purposes only. Performed at [facility], [place] [address] [place], [place] [address]   TSH     Status: None   Collection Time: 01/20/19  6:16 AM  Result Value Ref Range   TSH 1.006  0.350 - 4.500 uIU/mL    Comment: Performed by a 3rd Generation assay with a functional sensitivity of <=0.01 uIU/mL. Performed at [facility], [place] [address] [place], [place] [address]   Cortisol-am, blood     Status: Abnormal   Collection Time: 01/20/19  6:16 AM  Result Value Ref Range   Cortisol - AM 5.0 (L) 6.7 - 22.6 ug/dL    Comment: Performed at [facility] [address] [place], [place] [address]   Ct Head Wo Contrast  Result Date: 01/20/2019 CLINICAL DATA:  New onset seizure EXAM: CT HEAD WITHOUT CONTRAST TECHNIQUE: Contiguous axial images were obtained from the base of the skull through the vertex without intravenous contrast. COMPARISON:  None. FINDINGS: Brain: There is intracranial mass of the anterior cranial fossa suprasellar region that measures 4.6 x 3.0 x 2.6 cm. There is a large area of hypoattenuation adjacent to the frontal horn of the left lateral ventricle and at the inferior right frontal lobe adjacent to the mass. There is no intracranial hemorrhage. There is crowding of the basal cisterns, but they remain patent. Vascular: No abnormal hyperdensity of the major intracranial arteries or dural venous sinuses. No intracranial atherosclerosis. Skull: The visualized skull base, calvarium and extracranial soft tissues are normal. Sinuses/Orbits: No fluid levels or advanced mucosal thickening of the visualized paranasal sinuses. No mastoid or middle ear effusion. The orbits are normal. IMPRESSION: 1. Large mass of the inferior frontal lobe/anterior cranial fossa. It is not entirely clear on this study whether the mass is intra or extra-axial. MRI of the brain with and without contrast is recommended. 2. Large area of hypoattenuation adjacent to the frontal horn of the left lateral ventricle and at the inferior right frontal lobe adjacent to the mass. The periventricular findings could be secondary to a dilated ventricle filled with proteinaceous debris. However, both  areas may indicate cytotoxic edema in the setting of acute ischemia. 3. No intracranial hemorrhage. Critical Value/emergent results were called by telephone at the time of interpretation on 01/20/2019 at 2:00 am to [place] , who verbally acknowledged these results. Electronically Signed   By: Ulyses Jarred M.D.   On: 01/20/2019 02:00   Mr Brain W And Wo Contrast  Result Date: 01/20/2019 CLINICAL DATA:  Abnormal CT scan.  Intracranial mass. EXAM: MRI HEAD WITHOUT AND WITH CONTRAST TECHNIQUE: Multiplanar, multiecho pulse sequences of the brain and surrounding structures were obtained without and with intravenous  contrast. CONTRAST:  56mL GADAVIST GADOBUTROL 1 MMOL/ML IV SOLN COMPARISON:  CT head without contrast 01/20/2019 FINDINGS: Brain: A somewhat heterogeneous mass lesion is centered at the level of the foramen of Monro. There is extension into the sella and suprasellar regions. Central portions of the mass enhance homogeneously. There are peripheral cystic components. The lesion measures 5.6 x 4.8 x 4.5 cm. Pituitary gland is not visualized separately. The frontal horn of the left lateral ventricle is asymmetrically dilated. A small cavum septum pellucidum is dilated anteriorly is well. There is some edema in the inferior frontal lobes bilaterally. The tumor surrounds cavernous internal carotid arteries and proximal ACA and MCA vessels without occlusion. There is no invasion into the bone. No other lesions are present. No significant white matter disease is present. No significant extraaxial fluid collection is present. The brainstem and cerebellum are otherwise within normal limits. The internal auditory canals are normal. No other significant enhancement is present. Vascular: Flow is present in the major intracranial arteries. Skull and upper cervical spine: Craniocervical junction is otherwise within normal limits. Sinuses/Orbits: The globes and orbits are within normal limits. Posterior  left ethmoid air cells opacified. The paranasal sinuses and mastoid air cells are otherwise clear. IMPRESSION: 1. Central sellar and suprasellar mass lesion with peripheral cystic components measuring 5.6 x 4.8 x 4.5 cm. 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 The lesion does extend into the sella and craniopharyngioma is considered. Although there is no definite dural tail, meningioma is also considered. The absence of additional lesions makes metastatic disease less likely. 2. Dilated frontal horn of the left lateral ventricle is secondary to partial obstruction. 3. Mild edema in the anterior inferior frontal lobes. No other focal areas of edema. Electronically Signed   By: San Morelle M.D.   On: 01/20/2019 04:41    Pending Labs Unresulted Labs (From admission, onward)    Start     Ordered   01/20/19 0616  Growth hormone  Once,   STAT     01/20/19 0616   [id number]  Follicle stimulating hormone  Once,   STAT     01/20/19 0616   01/20/19 0616  Luteinizing hormone  Once,   STAT     01/20/19 0616   01/20/19 0616  Prolactin  Once,   STAT     01/20/19 0616   01/20/19 0615  HIV Antibody (routine testing w rflx)  (HIV Antibody (Routine testing w reflex) panel)  Once,   STAT     01/20/19 0616   01/20/19 0216  SARS CORONAVIRUS 2 (TAT 6-24 HRS) Nasopharyngeal Nasopharyngeal Swab  (Asymptomatic/Tier 2 Patients Labs)  Once,   STAT    Question Answer Comment  Is this test for diagnosis or screening Screening   Symptomatic for COVID-19 as defined by CDC No   Hospitalized for COVID-19 No   Admitted to ICU for COVID-19 No   Previously tested for COVID-19 No   Resident in a congregate (group) care setting No   Employed in healthcare setting No      01/20/19 0215          Vitals/Pain Today's Vitals   01/20/19 0600 01/20/19 0630 01/20/19 0700 01/20/19 0713  BP: 117/75 114/76 123/77   Pulse: 90 92 90   Resp: 13 16 16    Temp:      TempSrc:      SpO2: 93% 95% 96%    Weight:      Height:      PainSc:  0-No pain    Isolation Precautions No active isolations  Medications Medications  loratadine (CLARITIN) tablet 10 mg (has no administration in time range)  atorvastatin (LIPITOR) tablet 20 mg (has no administration in time range)  metFORMIN (GLUCOPHAGE) tablet 500 mg (has no administration in time range)  lisinopril (ZESTRIL) tablet 2.5 mg (has no administration in time range)  0.9 %  sodium chloride infusion ( Intravenous New Bag/Given 01/20/19 0655)  docusate sodium (COLACE) capsule 100 mg (has no administration in time range)  senna (SENOKOT) tablet 8.6 mg (has no administration in time range)  acetaminophen (TYLENOL) tablet 650 mg (has no administration in time range)    Or  acetaminophen (TYLENOL) suppository 650 mg (has no administration in time range)  HYDROcodone-acetaminophen (NORCO/VICODIN) 5-325 MG per tablet 1-2 tablet (has no administration in time range)  ondansetron (ZOFRAN) tablet 4 mg (has no administration in time range)    Or  ondansetron (ZOFRAN) injection 4 mg (has no administration in time range)  levETIRAcetam (KEPPRA) IVPB 500 mg/100 mL premix (has no administration in time range)  LORazepam (ATIVAN) injection 1 mg (1 mg Intravenous Given 01/20/19 0121)  levETIRAcetam (KEPPRA) IVPB 1000 mg/100 mL premix (0 mg Intravenous Stopped 01/20/19 0253)  gadobutrol (GADAVIST) 1 MMOL/ML injection 10 mL (10 mLs Intravenous Contrast Given 01/20/19 0352)    Mobility walks     Focused Assessments Neuro Assessment Handoff:  Swallow screen pass? Yes    NIH Stroke Scale ( + Modified Stroke Scale Criteria)  LOC Questions (1b. )   +: Answers neither question correctly LOC Commands (1c. )   + : Performs neither task correctly Best Gaze (2. )  +: Normal Visual (3. )  +: No visual loss Motor Arm, Left (5a. )   +: No drift Motor Arm, Right (5b. )   +: No drift Motor Leg, Left (6a. )   +: No drift Motor Leg, Right (6b. )   +: No  drift Sensory (8. )   +: Normal, no sensory loss Best Language (9. )   +: No aphasia Extinction/Inattention (11.)   +: No Abnormality Modified SS Total  +: 4     Neuro Assessment:   Neuro Checks:      Last Documented NIHSS Modified Score: 4 (01/20/19 [id number]) Has TPA been given? No If patient is a Neuro Trauma and patient is going to OR before floor call report to [facility] nurse: [phone] or [phone]     R Recommendations: See Admitting Provider Note  Report given to: 4N RN  Additional Notes: See provider note

## 2019-01-20 NOTE — ED Notes (Signed)
Patient assisted to stretcher and transferred to Trauma B , remained confused , IV started and given Ativan 1mg  IV .

## 2019-01-20 NOTE — H&P (Signed)
Subjective: Patient is a 33 y.o.  male admitted with an abnormal brain lesion. Patient's symptoms include headaches, seizure activity and visual symptoms including: blurred vision and visual loss. Onset of symptoms was gradual starting 1 year ago with gradually worsening course since that time.  His mother accompanies him today.  States that over the last couple days he has been not acting himself.  They went to dinner last night where he seemed to be more confused and stated he felt sick.  She brought him into the ED last night where he proceeded to have a seizure in the waiting room.  He denies any nausea or vomiting.  States that at times he feels like he is looking through a tunnel.  He has seen several ophthalmologist.  The last one he saw suggested he get further imaging to rule out pituitary tumor based on his ophthalmology exam.  Patient has a history of diabetes and asthma.  Mother states that he had a seizure when he was 76-year-old.  Patient states his left eye has been worse than his right eye  Past Medical History:  Diagnosis Date  . Asthma   . Diabetes mellitus without complication ([place])     History reviewed. No pertinent surgical history.  No Known Allergies  Social History   Tobacco Use  . Smoking status: Never Smoker  . Smokeless tobacco: Never Used  Substance Use Topics  . Alcohol use: No    No family history on file. Prior to Admission medications   Medication Sig Start Date End Date Taking? Authorizing Provider  atorvastatin (LIPITOR) 20 MG tablet Take 1 tablet (20 mg total) by mouth daily. 06/15/17   Jaynee Eagles, PA-C  blood glucose meter kit and supplies KIT Dispense based on patient and insurance preference. Use up to four times daily as directed. (FOR ICD-9 250.00, 250.01). 03/12/17   Vanessa Kick, MD  cetirizine (ZYRTEC) 10 MG tablet Take 10 mg by mouth daily.    [provider]  lisinopril (PRINIVIL,ZESTRIL) 2.5 MG tablet TAKE 1 TABLET BY MOUTH ONCE DAILY  02/28/18   Rutherford Guys, MD  metFORMIN (GLUCOPHAGE) 500 MG tablet Take 1 tablet (500 mg total) by mouth 2 (two) times daily with a meal. 12/05/17   Jaynee Eagles, PA-C     Review of Systems  Positive ROS: As above  All other systems have been reviewed and were otherwise negative with the exception of those mentioned in the HPI and as above.  Objective: Vital signs in last 24 hours: Temp:  [99.5 F (37.5 C)] 99.5 F (37.5 C) (11/08 2136) Pulse Rate:  [85-108] 102 (11/09 0530) Resp:  [15-22] 15 (11/09 0530) BP: (106-121)/(61-84) 121/75 (11/09 0530) SpO2:  [93 %-99 %] 95 % (11/09 0530) Weight:  [130 kg] 130 kg (11/08 2155)  General Appearance: Lethargic, cooperative, no distress, appears stated age Head: Normocephalic, without obvious abnormality, atraumatic Eyes: PERRL, conjunctiva/corneas clear, EOM's intact, fundi benign, both eyes      Lungs:  respirations unlabored Heart: Regular rate and rhythm Extremities: Extremities normal, atraumatic, no cyanosis or edema Pulses: 2+ and symmetric all extremities Skin: Skin color, texture, turgor normal, no rashes or lesions  NEUROLOGIC:  Mental status: Motor Exam - grossly normal, normal tone/ bulk Sensory Exam - grossly normal Reflexes: 2+ symmetric, no hoffman's or clonus Coordination - grossly normal Gait -not tested Balance -not tested Cranial Nerves: I: smell Not tested  II: visual acuity  OS: Decreased OD: Decreased  II: visual fields Full to confrontation  II: pupils Equal, round, reactive to light  III,VII: ptosis None  III,IV,VI: extraocular muscles  Full ROM  V: mastication Normal  V: facial light touch sensation  Normal  V,VII: corneal reflex  Present  VII: facial muscle function - upper  Normal  VII: facial muscle function - lower Normal  VIII: hearing Not tested  IX: soft palate elevation  Normal  IX,X: gag reflex Present  XI: trapezius strength  5/5  XI: sternocleidomastoid strength 5/5  XI: neck flexion  strength  5/5  XII: tongue strength  Normal    Data Review Lab Results  Component Value Date   WBC 5.6 01/28/2019   HGB 14.7 01/18/2019   HCT 43.1 01/23/2019   MCV 92.7 01/18/2019   PLT 257 01/27/2019   Lab Results  Component Value Date   NA 137 02/09/2019   K 4.2 02/09/2019   CL 99 01/31/2019   CO2 26 01/15/2019   BUN 17 01/15/2019   CREATININE 0.90 02/06/2019   GLUCOSE 143 (H) 01/23/2019   No results found for: INR, PROTIME  Assessment/Plan: 32-year-old patient presented to the ED last night after some confusion and visual changes over the last year.  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  We will get labs this morning and admit him to 4np for further monitoring.  We will discuss this case with Dr. Ostergard.  Would recommend neurology consult as well for seizure management.   Kimberly Hannah Meyran 01/20/2019 6:23 AM 

## 2019-01-20 NOTE — Progress Notes (Signed)
Neurosurgery Service Progress Note  Subjective: Skull base lesion, taking over care for Dr. Saintclair Halsted. Discussed w/ pt & his mom. Sounds like he had a uncomplicated febrile Sz at [facility], never had one afterwards, never required Tx. He has had roughly 1y of L then R visual changes, has been seeing ophthalmologists, one of which wrote him a note that mom has with her - states he had bitemporal hemianopsia at that time. He's post-ictal now and unable to do reliable visual fields. Denies any Sx of endocrinopathy but has been getting progressively more confused / cognitive decline over the past few months.   Objective: Vitals:   01/20/19 0530 01/20/19 0600 01/20/19 0630 01/20/19 0700  BP: 121/75 117/75 114/76 123/77  Pulse: (!) 102 90 92 90  Resp: '15 13 16 16  '$ Temp:      TempSrc:      SpO2: 95% 93% 95% 96%  Weight:      Height:       Temp (24hrs), Avg:99.5 F (37.5 C), Min:99.5 F (37.5 C), Max:99.5 F (37.5 C)  CBC Latest Ref Rng & Units 01/31/2019 03/12/2017 08/16/2011  WBC 4.0 - 10.5 K/uL 5.6 - 3.3(L)  Hemoglobin 13.0 - 17.0 g/dL 14.7 14.3 12.8(L)  Hematocrit 39.0 - 52.0 % 43.1 42.0 37.1(L)  Platelets 150 - 400 K/uL 257 - 166   BMP Latest Ref Rng & Units 01/22/2019 03/12/2017 08/16/2011  Glucose 70 - 99 mg/dL 143(H) 352(H) 106(H)  BUN 6 - 20 mg/dL '17 13 14  '$ Creatinine 0.61 - 1.24 mg/dL 0.90 0.80 0.93  Sodium 135 - 145 mmol/L 137 140 139  Potassium 3.5 - 5.1 mmol/L 4.2 3.9 4.0  Chloride 98 - 111 mmol/L 99 99(L) 105  CO2 22 - 32 mmol/L 26 - 27  Calcium 8.9 - 10.3 mg/dL 9.8 - 9.6   No intake or output data in the 24 hours ending 01/20/19 0809  Current Facility-Administered Medications:  .  0.9 %  sodium chloride infusion, , Intravenous, Continuous, Meyran, Ocie Cornfield, NP, Last Rate: 50 mL/hr at 01/20/19 0655 .  acetaminophen (TYLENOL) tablet 650 mg, 650 mg, Oral, Q6H PRN **OR** acetaminophen (TYLENOL) suppository 650 mg, 650 mg, Rectal, Q6H PRN, Meyran, Ocie Cornfield, NP .   atorvastatin (LIPITOR) tablet 20 mg, 20 mg, Oral, Daily, Meyran, Ocie Cornfield, NP .  docusate sodium (COLACE) capsule 100 mg, 100 mg, Oral, BID, Meyran, Ocie Cornfield, NP .  HYDROcodone-acetaminophen (NORCO/VICODIN) 5-325 MG per tablet 1-2 tablet, 1-2 tablet, Oral, Q4H PRN, Meyran, Ocie Cornfield, NP .  levETIRAcetam (KEPPRA) IVPB 500 mg/100 mL premix, 500 mg, Intravenous, Q12H, Meyran, Ocie Cornfield, NP .  lisinopril (ZESTRIL) tablet 2.5 mg, 2.5 mg, Oral, Daily, Meyran, Ocie Cornfield, NP .  loratadine (CLARITIN) tablet 10 mg, 10 mg, Oral, Daily, Meyran, Ocie Cornfield, NP .  metFORMIN (GLUCOPHAGE) tablet 500 mg, 500 mg, Oral, BID WC, Meyran, Ocie Cornfield, NP .  ondansetron (ZOFRAN) tablet 4 mg, 4 mg, Oral, Q6H PRN **OR** ondansetron (ZOFRAN) injection 4 mg, 4 mg, Intravenous, Q6H PRN, Meyran, Ocie Cornfield, NP .  senna (SENOKOT) tablet 8.6 mg, 1 tablet, Oral, BID, Meyran, Ocie Cornfield, NP  Current Outpatient Medications:  .  atorvastatin (LIPITOR) 20 MG tablet, Take 1 tablet (20 mg total) by mouth daily., Disp: 90 tablet, Rfl: 3 .  blood glucose meter kit and supplies KIT, Dispense based on patient and insurance preference. Use up to four times daily as directed. (FOR ICD-9 250.00, 250.01)., Disp: 1 each, Rfl: 0 .  cetirizine (ZYRTEC) 10  MG tablet, Take 10 mg by mouth daily., Disp: , Rfl:  .  lisinopril (PRINIVIL,ZESTRIL) 2.5 MG tablet, TAKE 1 TABLET BY MOUTH ONCE DAILY, Disp: 90 tablet, Rfl: 0 .  metFORMIN (GLUCOPHAGE) 500 MG tablet, Take 1 tablet (500 mg total) by mouth 2 (two) times daily with a meal., Disp: 180 tablet, Rfl: 1   Physical Exam: Somnolent but interactive, FCx4 w/o preference, gaze neutral, some bitemporal hemianopsia but unable to fully evaluate.  Assessment & Plan: 33 y.o. man w/ new onset Sz in the setting of progressive visual loss and confusion. MRI shows cystic enhancing suprasellar mass L>R encasing the ICAs/MCAs/ACAs, extending past the cavernous  sinus on the L, multiple cystic foci with large left frontal cystic structure, optic nerves compressed and displaced, infundibulum is midline and displaced. 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  -discussed w/ pt and his mom, will require operative resection, will get pituitary labs, Tx Sz, get better visual field exam after post-ictal state resolves, given his worsening mentation w/ hypothalamic compression and worsening vision w/ optic N compression, I recommend surgery this week, will update notes when operative date known -can have regular diet now  Judith Part  01/20/19 8:09 AM

## 2019-01-20 NOTE — ED Notes (Signed)
Regular Diet was ordered for Lunch. 

## 2019-01-20 NOTE — Progress Notes (Signed)
Pt arrived on floor, transferred from ED. Patient is alert and oriented and in no acute distress but lethargic. PIV is infusing NS at 57ml/hr. SCD's placed on patient lower extremieties & turned on. Patient orienated to room and unit. Bedside table and personal belongings within reach of patient. Patient educated on usage of nurse call bell, placed within reach of patient. Patient instructed to call for assistance. Patient in bed. Will continue to monitor.

## 2019-01-21 LAB — FOLLICLE STIMULATING HORMONE: FSH: 5.8 m[IU]/mL (ref 1.5–12.4)

## 2019-01-21 LAB — MRSA PCR SCREENING: MRSA by PCR: NEGATIVE

## 2019-01-21 LAB — CORTISOL-AM, BLOOD: Cortisol - AM: 11.1 ug/dL (ref 6.7–22.6)

## 2019-01-21 LAB — GROWTH HORMONE: Growth Hormone: 1.1 ng/mL (ref 0.0–10.0)

## 2019-01-21 LAB — LUTEINIZING HORMONE: LH: 8.7 m[IU]/mL — ABNORMAL HIGH (ref 1.7–8.6)

## 2019-01-21 MED ORDER — LEVETIRACETAM 500 MG PO TABS
500.0000 mg | ORAL_TABLET | Freq: Two times a day (BID) | ORAL | Status: DC
  Administered 2019-01-21 – 2019-01-22 (×4): 500 mg via ORAL
  Filled 2019-01-21 (×4): qty 1

## 2019-01-21 NOTE — Progress Notes (Signed)
Neurosurgery Service Progress Note  Subjective: NAE ON   Objective: Vitals:   01/20/19 2028 01/21/19 0020 01/21/19 0400 01/21/19 0438  BP: 117/77 110/73  119/76  Pulse: 80 71 80 64  Resp:      Temp: 97.6 F (36.4 C) 98.1 F (36.7 C)  98 F (36.7 C)  TempSrc: Oral Oral  Oral  SpO2:   100%   Weight:      Height:       Temp (24hrs), Avg:98.1 F (36.7 C), Min:97.6 F (36.4 C), Max:98.5 F (36.9 C)  CBC Latest Ref Rng & Units 01/21/2019 03/12/2017 08/16/2011  WBC 4.0 - 10.5 K/uL 5.6 - 3.3(L)  Hemoglobin 13.0 - 17.0 g/dL 14.7 14.3 12.8(L)  Hematocrit 39.0 - 52.0 % 43.1 42.0 37.1(L)  Platelets 150 - 400 K/uL 257 - 166   BMP Latest Ref Rng & Units 02/10/2019 03/12/2017 08/16/2011  Glucose 70 - 99 mg/dL 143(H) 352(H) 106(H)  BUN 6 - 20 mg/dL 17 13 14   Creatinine 0.61 - 1.24 mg/dL 0.90 0.80 0.93  Sodium 135 - 145 mmol/L 137 140 139  Potassium 3.5 - 5.1 mmol/L 4.2 3.9 4.0  Chloride 98 - 111 mmol/L 99 99(L) 105  CO2 22 - 32 mmol/L 26 - 27  Calcium 8.9 - 10.3 mg/dL 9.8 - 9.6    Intake/Output Summary (Last 24 hours) at 01/21/2019 0648 Last data filed at 01/21/2019 0421 Gross per 24 hour  Intake 1248.8 ml  Output 450 ml  Net 798.8 ml    Current Facility-Administered Medications:  .  0.9 %  sodium chloride infusion, , Intravenous, Continuous, Meyran, Ocie Cornfield, NP, Last Rate: 50 mL/hr at 01/20/19 0655 .  acetaminophen (TYLENOL) tablet 650 mg, 650 mg, Oral, Q6H PRN **OR** acetaminophen (TYLENOL) suppository 650 mg, 650 mg, Rectal, Q6H PRN, Meyran, Ocie Cornfield, NP .  atorvastatin (LIPITOR) tablet 20 mg, 20 mg, Oral, Daily, Meyran, Ocie Cornfield, NP, 20 mg at 01/20/19 1026 .  docusate sodium (COLACE) capsule 100 mg, 100 mg, Oral, BID, Meyran, Ocie Cornfield, NP, 100 mg at 01/20/19 2112 .  HYDROcodone-acetaminophen (NORCO/VICODIN) 5-325 MG per tablet 1-2 tablet, 1-2 tablet, Oral, Q4H PRN, Meyran, Ocie Cornfield, NP .  levETIRAcetam (KEPPRA) IVPB 500 mg/100 mL premix, 500  mg, Intravenous, Q12H, Meyran, Ocie Cornfield, NP, Last Rate: 400 mL/hr at 01/20/19 2114, 500 mg at 01/20/19 2114 .  lisinopril (ZESTRIL) tablet 2.5 mg, 2.5 mg, Oral, Daily, Meyran, Ocie Cornfield, NP, 2.5 mg at 01/20/19 1026 .  loratadine (CLARITIN) tablet 10 mg, 10 mg, Oral, Daily, Meyran, Ocie Cornfield, NP, 10 mg at 01/20/19 1027 .  metFORMIN (GLUCOPHAGE) tablet 500 mg, 500 mg, Oral, BID WC, Meyran, Ocie Cornfield, NP, 500 mg at 01/20/19 1734 .  ondansetron (ZOFRAN) tablet 4 mg, 4 mg, Oral, Q6H PRN **OR** ondansetron (ZOFRAN) injection 4 mg, 4 mg, Intravenous, Q6H PRN, Meyran, Ocie Cornfield, NP .  senna (SENOKOT) tablet 8.6 mg, 1 tablet, Oral, BID, Meyran, Ocie Cornfield, NP, 8.6 mg at 01/20/19 2112   Physical Exam: Mildly somnolent but interactive, Ox3, FCx4 w/o preference, gaze neutral, +bitemporal hemianopsia  Assessment & Plan: 33 y.o. man w/ new onset Sz in the setting of progressive visual loss and confusion. MRI shows cystic enhancing suprasellar mass L>R encasing the ICAs/MCAs/ACAs, extending past the cavernous sinus on the L, multiple cystic foci with large left frontal cystic structure, optic nerves compressed and displaced, infundibulum is midline and displaced. 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  -OR 11/12 for resection -PRL &  [place] pending, TSH WNL, LH mildly elevated -AM cortisol 5.0, no clinical signs/sx of AI, will repeat today to confirm to avoid requiring long term GC supplementation, will start hydrocort if deficient -needs volumetric MRI for OR, will obtain today -will start levitiracetam 500mg  bid for seizure and continue perioperatively  Judith Part  01/21/19 6:48 AM

## 2019-01-22 ENCOUNTER — Inpatient Hospital Stay (HOSPITAL_COMMUNITY): Payer: Self-pay

## 2019-01-22 LAB — PROLACTIN: Prolactin: >4700 ng/mL — AB (ref 4.0–15.2)

## 2019-01-22 MED ORDER — GADOBUTROL 1 MMOL/ML IV SOLN
10.0000 mL | Freq: Once | INTRAVENOUS | Status: AC | PRN
  Administered 2019-01-22: 18:00:00 10 mL via INTRAVENOUS

## 2019-01-22 NOTE — Progress Notes (Signed)
Neurosurgery Service Progress Note  Subjective: NAE ON   Objective: Vitals:   01/21/19 1930 01/21/19 2041 01/22/19 0014 01/22/19 0330  BP:  123/74 123/83 122/84  Pulse: 86 90 79 70  Resp: 19 (!) 22 17 11   Temp:  98 F (36.7 C) 98.2 F (36.8 C) 98.4 F (36.9 C)  TempSrc:  Oral Oral Oral  SpO2: 96% 97% 98% 98%  Weight:      Height:       Temp (24hrs), Avg:98.2 F (36.8 C), Min:98 F (36.7 C), Max:98.4 F (36.9 C)  CBC Latest Ref Rng & Units 01/17/2019 03/12/2017 08/16/2011  WBC 4.0 - 10.5 K/uL 5.6 - 3.3(L)  Hemoglobin 13.0 - 17.0 g/dL 14.7 14.3 12.8(L)  Hematocrit 39.0 - 52.0 % 43.1 42.0 37.1(L)  Platelets 150 - 400 K/uL 257 - 166   BMP Latest Ref Rng & Units 01/31/2019 03/12/2017 08/16/2011  Glucose 70 - 99 mg/dL 143(H) 352(H) 106(H)  BUN 6 - 20 mg/dL 17 13 14   Creatinine 0.61 - 1.24 mg/dL 0.90 0.80 0.93  Sodium 135 - 145 mmol/L 137 140 139  Potassium 3.5 - 5.1 mmol/L 4.2 3.9 4.0  Chloride 98 - 111 mmol/L 99 99(L) 105  CO2 22 - 32 mmol/L 26 - 27  Calcium 8.9 - 10.3 mg/dL 9.8 - 9.6    Intake/Output Summary (Last 24 hours) at 01/22/2019 0740 Last data filed at 01/22/2019 0330 Gross per 24 hour  Intake 1688.88 ml  Output 800 ml  Net 888.88 ml    Current Facility-Administered Medications:  .  0.9 %  sodium chloride infusion, , Intravenous, Continuous, Meyran, Ocie Cornfield, NP, Last Rate: 50 mL/hr at 01/21/19 1838 .  acetaminophen (TYLENOL) tablet 650 mg, 650 mg, Oral, Q6H PRN **OR** acetaminophen (TYLENOL) suppository 650 mg, 650 mg, Rectal, Q6H PRN, Meyran, Ocie Cornfield, NP .  atorvastatin (LIPITOR) tablet 20 mg, 20 mg, Oral, Daily, Meyran, Ocie Cornfield, NP, 20 mg at 01/21/19 0932 .  docusate sodium (COLACE) capsule 100 mg, 100 mg, Oral, BID, Meyran, Ocie Cornfield, NP, 100 mg at 01/21/19 0932 .  HYDROcodone-acetaminophen (NORCO/VICODIN) 5-325 MG per tablet 1-2 tablet, 1-2 tablet, Oral, Q4H PRN, Meyran, Ocie Cornfield, NP .  levETIRAcetam (KEPPRA) tablet 500  mg, 500 mg, Oral, BID, Judith Part, MD, 500 mg at 01/21/19 2131 .  lisinopril (ZESTRIL) tablet 2.5 mg, 2.5 mg, Oral, Daily, Meyran, Ocie Cornfield, NP, 2.5 mg at 01/21/19 0932 .  loratadine (CLARITIN) tablet 10 mg, 10 mg, Oral, Daily, Meyran, Ocie Cornfield, NP, 10 mg at 01/21/19 0932 .  metFORMIN (GLUCOPHAGE) tablet 500 mg, 500 mg, Oral, BID WC, Meyran, Ocie Cornfield, NP, 500 mg at 01/21/19 1731 .  ondansetron (ZOFRAN) tablet 4 mg, 4 mg, Oral, Q6H PRN **OR** ondansetron (ZOFRAN) injection 4 mg, 4 mg, Intravenous, Q6H PRN, Meyran, Ocie Cornfield, NP .  senna (SENOKOT) tablet 8.6 mg, 1 tablet, Oral, BID, Meyran, Ocie Cornfield, NP, 8.6 mg at 01/20/19 2112   Physical Exam: Mildly somnolent but interactive, Ox3, FCx4 w/o preference, gaze neutral, +bitemporal hemianopsia  Assessment & Plan: 33 y.o. man w/ new onset Sz in the setting of progressive visual loss and confusion. MRI shows cystic enhancing suprasellar mass L>R encasing the ICAs/MCAs/ACAs, extending past the cavernous sinus on the L, multiple cystic foci with large left frontal cystic structure, optic nerves compressed and displaced, infundibulum is midline and displaced. 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  -OR tomorrow for resection, NPO p MN -PRL pending, GH, TSH WNL, LH  mildly elevated -AM cortisol 5.0, rpt 11, confirmed my suspicions that he likely does not have AI. Will stress-dose in the OR and monitor post-op, but won't commit him to long term GCs at this point -needs volumetric MRI for OR, will obtain today -cont levitiracetam 500mg  bid for seizure and continue post-operatively  Judith Part  01/22/19 7:40 AM

## 2019-01-22 NOTE — Anesthesia Preprocedure Evaluation (Addendum)
Anesthesia Evaluation  Patient identified by MRN, date of birth, ID band Patient awake    Reviewed: Allergy & Precautions, NPO status , Patient's Chart, lab work & pertinent test results  History of Anesthesia Complications Negative for: history of anesthetic complications  Airway Mallampati: II  TM Distance: >3 FB Neck ROM: Full    Dental no notable dental hx.    Pulmonary asthma ,    Pulmonary exam normal        Cardiovascular hypertension, Pt. on medications Normal cardiovascular exam     Neuro/Psych New onset seizures, progressive visual loss and confusion, suprasellar mass L>R encasing the ICAs/MCAs/ACAs  negative psych ROS   GI/Hepatic negative GI ROS, Neg liver ROS,   Endo/Other  diabetes, Type 2, Insulin Dependent  Renal/GU negative Renal ROS  negative genitourinary   Musculoskeletal negative musculoskeletal ROS (+)   Abdominal   Peds  Hematology negative hematology ROS (+)   Anesthesia Other Findings Day of surgery medications reviewed with patient.  Reproductive/Obstetrics negative OB ROS                            Anesthesia Physical Anesthesia Plan  ASA: III  Anesthesia Plan: General   Post-op Pain Management:    Induction: Intravenous  PONV Risk Score and Plan: 3 and Treatment may vary due to age or medical condition, Ondansetron and Dexamethasone  Airway Management Planned: Oral ETT  Additional Equipment: Arterial line  Intra-op Plan:   Post-operative Plan: Possible Post-op intubation/ventilation  Informed Consent: I have reviewed the patients History and Physical, chart, labs and discussed the procedure including the risks, benefits and alternatives for the proposed anesthesia with the patient or authorized representative who has indicated his/her understanding and acceptance.     Dental advisory given  Plan Discussed with: CRNA  Anesthesia Plan  Comments: (PIV x2, A-line)      Anesthesia Quick Evaluation

## 2019-01-23 ENCOUNTER — Encounter (HOSPITAL_COMMUNITY): Admission: EM | Disposition: E | Payer: Self-pay | Source: Home / Self Care | Attending: Neurological Surgery

## 2019-01-23 ENCOUNTER — Encounter (HOSPITAL_COMMUNITY): Payer: Self-pay | Admitting: *Deleted

## 2019-01-23 ENCOUNTER — Inpatient Hospital Stay (HOSPITAL_COMMUNITY): Payer: Self-pay

## 2019-01-23 ENCOUNTER — Inpatient Hospital Stay (HOSPITAL_COMMUNITY): Payer: Self-pay | Admitting: Anesthesiology

## 2019-01-23 DIAGNOSIS — D496 Neoplasm of unspecified behavior of brain: Secondary | ICD-10-CM | POA: Diagnosis present

## 2019-01-23 HISTORY — PX: CRANIOTOMY: SHX93

## 2019-01-23 LAB — GLUCOSE, CAPILLARY
Glucose-Capillary: 146 mg/dL — ABNORMAL HIGH (ref 70–99)
Glucose-Capillary: 247 mg/dL — ABNORMAL HIGH (ref 70–99)
Glucose-Capillary: 227 mg/dL — ABNORMAL HIGH (ref 70–99)

## 2019-01-23 LAB — ABO/RH: ABO/RH(D): A NEG

## 2019-01-23 SURGERY — CRANIOTOMY TUMOR EXCISION
Anesthesia: General | Laterality: Left

## 2019-01-23 MED ORDER — FENTANYL CITRATE (PF) 250 MCG/5ML IJ SOLN
INTRAMUSCULAR | Status: AC
  Filled 2019-01-23: qty 5

## 2019-01-23 MED ORDER — DEXAMETHASONE SODIUM PHOSPHATE 10 MG/ML IJ SOLN
INTRAMUSCULAR | Status: DC | PRN
  Administered 2019-01-23: 10 mg via INTRAVENOUS

## 2019-01-23 MED ORDER — MIDAZOLAM HCL 5 MG/5ML IJ SOLN
INTRAMUSCULAR | Status: DC | PRN
  Administered 2019-01-23: 2 mg via INTRAVENOUS

## 2019-01-23 MED ORDER — LABETALOL HCL 5 MG/ML IV SOLN: 10.0000 mg | INTRAVENOUS | Status: DC | PRN

## 2019-01-23 MED ORDER — MANNITOL 25 % IV SOLN
INTRAVENOUS | Status: DC | PRN
  Administered 2019-01-23: 100 g via INTRAVENOUS

## 2019-01-23 MED ORDER — ROCURONIUM BROMIDE 10 MG/ML (PF) SYRINGE
PREFILLED_SYRINGE | INTRAVENOUS | Status: AC
  Filled 2019-01-23: qty 10

## 2019-01-23 MED ORDER — LIDOCAINE-EPINEPHRINE 1 %-1:100000 IJ SOLN
INTRAMUSCULAR | Status: AC
  Filled 2019-01-23: qty 1

## 2019-01-23 MED ORDER — ONDANSETRON HCL 4 MG/2ML IJ SOLN
INTRAMUSCULAR | Status: DC | PRN
  Administered 2019-01-23: 4 mg via INTRAVENOUS

## 2019-01-23 MED ORDER — PROPOFOL 10 MG/ML IV BOLUS
INTRAVENOUS | Status: AC
  Filled 2019-01-23: qty 20

## 2019-01-23 MED ORDER — POLYETHYLENE GLYCOL 3350 17 G PO PACK: 17.0000 g | PACK | Freq: Every day | ORAL | Status: DC | PRN

## 2019-01-23 MED ORDER — FENTANYL CITRATE (PF) 250 MCG/5ML IJ SOLN
INTRAMUSCULAR | Status: DC | PRN
  Administered 2019-01-23: 150 ug via INTRAVENOUS
  Administered 2019-01-23 (×2): 50 ug via INTRAVENOUS
  Administered 2019-01-23: 100 ug via INTRAVENOUS

## 2019-01-23 MED ORDER — BACITRACIN ZINC 500 UNIT/GM EX OINT
TOPICAL_OINTMENT | CUTANEOUS | Status: AC
  Filled 2019-01-23: qty 28.35

## 2019-01-23 MED ORDER — SUCCINYLCHOLINE CHLORIDE 200 MG/10ML IV SOSY
PREFILLED_SYRINGE | INTRAVENOUS | Status: AC
  Filled 2019-01-23: qty 10

## 2019-01-23 MED ORDER — ALBUMIN HUMAN 5 % IV SOLN
INTRAVENOUS | Status: DC | PRN
  Administered 2019-01-23 (×3): via INTRAVENOUS

## 2019-01-23 MED ORDER — CEFAZOLIN SODIUM-DEXTROSE 2-4 GM/100ML-% IV SOLN
INTRAVENOUS | Status: AC
  Filled 2019-01-23: qty 100

## 2019-01-23 MED ORDER — SODIUM CHLORIDE 0.9 % IV SOLN
INTRAVENOUS | Status: DC
  Administered 2019-01-23: 09:00:00 via INTRAVENOUS

## 2019-01-23 MED ORDER — LIDOCAINE-EPINEPHRINE 1 %-1:100000 IJ SOLN
INTRAMUSCULAR | Status: DC | PRN
  Administered 2019-01-23: 10 mL

## 2019-01-23 MED ORDER — LIDOCAINE 2% (20 MG/ML) 5 ML SYRINGE
INTRAMUSCULAR | Status: AC
  Filled 2019-01-23: qty 5

## 2019-01-23 MED ORDER — CEFAZOLIN SODIUM-DEXTROSE 2-4 GM/100ML-% IV SOLN
2.0000 g | Freq: Three times a day (TID) | INTRAVENOUS | Status: AC
  Administered 2019-01-23 – 2019-01-24 (×2): 2 g via INTRAVENOUS
  Filled 2019-01-23 (×2): qty 100

## 2019-01-23 MED ORDER — PROPOFOL 10 MG/ML IV BOLUS
INTRAVENOUS | Status: DC | PRN
  Administered 2019-01-23: 100 mg via INTRAVENOUS
  Administered 2019-01-23: 200 mg via INTRAVENOUS

## 2019-01-23 MED ORDER — 0.9 % SODIUM CHLORIDE (POUR BTL) OPTIME
TOPICAL | Status: DC | PRN
  Administered 2019-01-23 (×3): 1000 mL

## 2019-01-23 MED ORDER — MIDAZOLAM HCL 2 MG/2ML IJ SOLN
INTRAMUSCULAR | Status: AC
  Filled 2019-01-23: qty 2

## 2019-01-23 MED ORDER — SUGAMMADEX SODIUM 500 MG/5ML IV SOLN
INTRAVENOUS | Status: DC | PRN
  Administered 2019-01-23: 300 mg via INTRAVENOUS

## 2019-01-23 MED ORDER — PHENYLEPHRINE 40 MCG/ML (10ML) SYRINGE FOR IV PUSH (FOR BLOOD PRESSURE SUPPORT)
PREFILLED_SYRINGE | INTRAVENOUS | Status: DC | PRN
  Administered 2019-01-23 (×2): 120 ug via INTRAVENOUS

## 2019-01-23 MED ORDER — OXYCODONE HCL 5 MG PO TABS: 5.0000 mg | ORAL_TABLET | Freq: Once | ORAL | Status: DC | PRN

## 2019-01-23 MED ORDER — BACITRACIN ZINC 500 UNIT/GM EX OINT
TOPICAL_OINTMENT | CUTANEOUS | Status: DC | PRN
  Administered 2019-01-23: 1 via TOPICAL

## 2019-01-23 MED ORDER — PHENYLEPHRINE HCL (PRESSORS) 10 MG/ML IV SOLN
INTRAVENOUS | Status: DC | PRN
  Administered 2019-01-23 (×2): 100 ug via INTRAVENOUS

## 2019-01-23 MED ORDER — PROMETHAZINE HCL 25 MG/ML IJ SOLN: 6.2500 mg | INTRAMUSCULAR | Status: DC | PRN

## 2019-01-23 MED ORDER — THROMBIN 20000 UNITS EX SOLR
CUTANEOUS | Status: DC | PRN
  Administered 2019-01-23: 20 mL via TOPICAL

## 2019-01-23 MED ORDER — EPHEDRINE SULFATE 50 MG/ML IJ SOLN
INTRAMUSCULAR | Status: DC | PRN
  Administered 2019-01-23: 10 mg via INTRAVENOUS

## 2019-01-23 MED ORDER — HEPARIN SODIUM (PORCINE) 5000 UNIT/ML IJ SOLN: 5000.0000 [IU] | Freq: Three times a day (TID) | INTRAMUSCULAR | Status: DC

## 2019-01-23 MED ORDER — OXYCODONE HCL 5 MG/5ML PO SOLN: 5.0000 mg | Freq: Once | ORAL | Status: DC | PRN

## 2019-01-23 MED ORDER — PHENYLEPHRINE HCL-NACL 10-0.9 MG/250ML-% IV SOLN
INTRAVENOUS | Status: DC | PRN
  Administered 2019-01-23: 25 ug/min via INTRAVENOUS

## 2019-01-23 MED ORDER — THROMBIN 5000 UNITS EX SOLR
CUTANEOUS | Status: AC
  Filled 2019-01-23: qty 5000

## 2019-01-23 MED ORDER — PHENYLEPHRINE 40 MCG/ML (10ML) SYRINGE FOR IV PUSH (FOR BLOOD PRESSURE SUPPORT)
PREFILLED_SYRINGE | INTRAVENOUS | Status: AC
  Filled 2019-01-23: qty 10

## 2019-01-23 MED ORDER — ROCURONIUM BROMIDE 10 MG/ML (PF) SYRINGE
PREFILLED_SYRINGE | INTRAVENOUS | Status: DC | PRN
  Administered 2019-01-23: 70 mg via INTRAVENOUS
  Administered 2019-01-23: 20 mg via INTRAVENOUS
  Administered 2019-01-23: 10 mg via INTRAVENOUS
  Administered 2019-01-23: 20 mg via INTRAVENOUS
  Administered 2019-01-23: 10 mg via INTRAVENOUS
  Administered 2019-01-23: 20 mg via INTRAVENOUS
  Administered 2019-01-23: 10 mg via INTRAVENOUS
  Administered 2019-01-23: 20 mg via INTRAVENOUS

## 2019-01-23 MED ORDER — LIDOCAINE 2% (20 MG/ML) 5 ML SYRINGE
INTRAMUSCULAR | Status: DC | PRN
  Administered 2019-01-23: 40 mg via INTRAVENOUS
  Administered 2019-01-23: 60 mg via INTRAVENOUS

## 2019-01-23 MED ORDER — ACETAMINOPHEN 10 MG/ML IV SOLN: 1000.0000 mg | Freq: Once | INTRAVENOUS | Status: DC | PRN

## 2019-01-23 MED ORDER — CEFAZOLIN SODIUM-DEXTROSE 2-3 GM-%(50ML) IV SOLR
INTRAVENOUS | Status: DC | PRN
  Administered 2019-01-23 (×2): 2 g via INTRAVENOUS

## 2019-01-23 MED ORDER — CHLORHEXIDINE GLUCONATE CLOTH 2 % EX PADS
6.0000 | MEDICATED_PAD | Freq: Every day | CUTANEOUS | Status: DC
  Administered 2019-01-23 – 2019-01-24 (×2): 6 via TOPICAL

## 2019-01-23 MED ORDER — FENTANYL CITRATE (PF) 100 MCG/2ML IJ SOLN: 25.0000 ug | INTRAMUSCULAR | Status: DC | PRN

## 2019-01-23 MED ORDER — THROMBIN 20000 UNITS EX SOLR
CUTANEOUS | Status: AC
  Filled 2019-01-23: qty 20000

## 2019-01-23 MED ORDER — SODIUM CHLORIDE 0.9 % IV SOLN
INTRAVENOUS | Status: DC | PRN
  Administered 2019-01-23: 500 mL

## 2019-01-23 MED ORDER — THROMBIN 5000 UNITS EX SOLR
OROMUCOSAL | Status: DC | PRN
  Administered 2019-01-23: 5 mL via TOPICAL

## 2019-01-23 SURGICAL SUPPLY — 112 items
BENZOIN TINCTURE PRP APPL 2/3 (GAUZE/BANDAGES/DRESSINGS) IMPLANT
BLADE 11 SAFETY STRL DISP (BLADE) ×3 IMPLANT
BLADE CLIPPER SURG (BLADE) ×3 IMPLANT
BLADE OSCILLATING/SAGITTAL (BLADE) ×2
BLADE SAW GIGLI 16 STRL (MISCELLANEOUS) IMPLANT
BLADE SURG 15 STRL LF DISP TIS (BLADE) IMPLANT
BLADE SURG 15 STRL SS (BLADE)
BLADE SW THK.38XMED NAR THN (BLADE) ×1 IMPLANT
BNDG GAUZE ELAST 4 BULKY (GAUZE/BANDAGES/DRESSINGS) IMPLANT
BNDG STRETCH 4X75 STRL LF (GAUZE/BANDAGES/DRESSINGS) IMPLANT
BUR ACORN 9.0 PRECISION (BURR) ×2 IMPLANT
BUR ACORN 9.0MM PRECISION (BURR) ×1
BUR MATCHSTICK NEURO 3.0 LAGG (BURR) ×3 IMPLANT
BUR ROUND FLUTED 4 SOFT TCH (BURR) IMPLANT
BUR ROUND FLUTED 4MM SOFT TCH (BURR)
BUR SABER DIAMOND 2.5 (BURR) ×3 IMPLANT
BUR SPIRAL ROUTER 2.3 (BUR) ×2 IMPLANT
BUR SPIRAL ROUTER 2.3MM (BUR) ×1
CANISTER SUCT 3000ML PPV (MISCELLANEOUS) ×6 IMPLANT
CATH VENTRIC 35X38 W/TROCAR LG (CATHETERS) ×3 IMPLANT
CLIP VESOCCLUDE MED 6/CT (CLIP) IMPLANT
CONT SPEC 4OZ CLIKSEAL STRL BL (MISCELLANEOUS) ×6 IMPLANT
COVER MAYO STAND STRL (DRAPES) IMPLANT
COVER WAND RF STERILE (DRAPES) ×3 IMPLANT
DECANTER SPIKE VIAL GLASS SM (MISCELLANEOUS) ×3 IMPLANT
DRAIN SUBARACHNOID (WOUND CARE) IMPLANT
DRAPE HALF SHEET 40X57 (DRAPES) ×3 IMPLANT
DRAPE MICROSCOPE LEICA (MISCELLANEOUS) ×6 IMPLANT
DRAPE NEUROLOGICAL W/INCISE (DRAPES) ×3 IMPLANT
DRAPE STERI IOBAN 125X83 (DRAPES) IMPLANT
DRAPE SURG 17X23 STRL (DRAPES) IMPLANT
DRAPE WARM FLUID 44X44 (DRAPES) ×3 IMPLANT
DRSG ADAPTIC 3X8 NADH LF (GAUZE/BANDAGES/DRESSINGS) IMPLANT
DRSG TELFA 3X8 NADH (GAUZE/BANDAGES/DRESSINGS) IMPLANT
DURAPREP 6ML APPLICATOR 50/CS (WOUND CARE) ×3 IMPLANT
ELECT REM PT RETURN 9FT ADLT (ELECTROSURGICAL) ×3
ELECTRODE REM PT RTRN 9FT ADLT (ELECTROSURGICAL) ×1 IMPLANT
EVACUATOR 1/8 PVC DRAIN (DRAIN) IMPLANT
EVACUATOR SILICONE 100CC (DRAIN) IMPLANT
FORCEPS BIPOLAR SPETZLER 8 1.0 (NEUROSURGERY SUPPLIES) ×3 IMPLANT
GAUZE 4X4 16PLY RFD (DISPOSABLE) IMPLANT
GAUZE SPONGE 4X4 12PLY STRL (GAUZE/BANDAGES/DRESSINGS) IMPLANT
GLOVE BIO SURGEON STRL SZ 6.5 (GLOVE) ×6 IMPLANT
GLOVE BIO SURGEON STRL SZ7 (GLOVE) ×3 IMPLANT
GLOVE BIO SURGEON STRL SZ7.5 (GLOVE) ×3 IMPLANT
GLOVE BIO SURGEONS STRL SZ 6.5 (GLOVE) ×3
GLOVE BIOGEL PI IND STRL 6.5 (GLOVE) ×1 IMPLANT
GLOVE BIOGEL PI IND STRL 7.0 (GLOVE) ×1 IMPLANT
GLOVE BIOGEL PI IND STRL 7.5 (GLOVE) ×3 IMPLANT
GLOVE BIOGEL PI IND STRL 8.5 (GLOVE) ×2 IMPLANT
GLOVE BIOGEL PI INDICATOR 6.5 (GLOVE) ×2
GLOVE BIOGEL PI INDICATOR 7.0 (GLOVE) ×2
GLOVE BIOGEL PI INDICATOR 7.5 (GLOVE) ×6
GLOVE BIOGEL PI INDICATOR 8.5 (GLOVE) ×4
GLOVE ECLIPSE 8.5 STRL (GLOVE) ×6 IMPLANT
GLOVE EXAM NITRILE LRG STRL (GLOVE) IMPLANT
GLOVE EXAM NITRILE XL STR (GLOVE) IMPLANT
GLOVE EXAM NITRILE XS STR PU (GLOVE) IMPLANT
GLOVE SURG SS PI 7.0 STRL IVOR (GLOVE) ×6 IMPLANT
GLOVE SURG SS PI 7.5 STRL IVOR (GLOVE) ×9 IMPLANT
GOWN STRL REUS W/ TWL LRG LVL3 (GOWN DISPOSABLE) ×4 IMPLANT
GOWN STRL REUS W/ TWL XL LVL3 (GOWN DISPOSABLE) ×1 IMPLANT
GOWN STRL REUS W/TWL 2XL LVL3 (GOWN DISPOSABLE) ×9 IMPLANT
GOWN STRL REUS W/TWL LRG LVL3 (GOWN DISPOSABLE) ×8
GOWN STRL REUS W/TWL XL LVL3 (GOWN DISPOSABLE) ×2
HEMOSTAT POWDER KIT SURGIFOAM (HEMOSTASIS) ×3 IMPLANT
HEMOSTAT SURGICEL 2X14 (HEMOSTASIS) ×3 IMPLANT
HOOK DURA 1/2IN (MISCELLANEOUS) ×3 IMPLANT
IV NS 1000ML (IV SOLUTION) ×2
IV NS 1000ML BAXH (IV SOLUTION) ×1 IMPLANT
KIT BASIN OR (CUSTOM PROCEDURE TRAY) ×3 IMPLANT
KIT DRAIN CSF ACCUDRAIN (MISCELLANEOUS) ×3 IMPLANT
KIT TURNOVER KIT B (KITS) ×3 IMPLANT
NEEDLE HYPO 22GX1.5 SAFETY (NEEDLE) ×3 IMPLANT
NEEDLE SPNL 18GX3.5 QUINCKE PK (NEEDLE) IMPLANT
NS IRRIG 1000ML POUR BTL (IV SOLUTION) ×9 IMPLANT
PACK CRANIOTOMY CUSTOM (CUSTOM PROCEDURE TRAY) ×3 IMPLANT
PATTIES SURGICAL .25X.25 (GAUZE/BANDAGES/DRESSINGS) IMPLANT
PATTIES SURGICAL .5 X.5 (GAUZE/BANDAGES/DRESSINGS) ×6 IMPLANT
PATTIES SURGICAL .5 X3 (DISPOSABLE) IMPLANT
PATTIES SURGICAL 1/4 X 3 (GAUZE/BANDAGES/DRESSINGS) IMPLANT
PATTIES SURGICAL 1X1 (DISPOSABLE) ×3 IMPLANT
PIN MAYFIELD SKULL DISP (PIN) ×3 IMPLANT
PLATE 1.5 2H 17 DOU T (Plate) ×3 IMPLANT
PLATE 1.5/0.5 13MM BURR HOLE (Plate) ×6 IMPLANT
RUBBERBAND STERILE (MISCELLANEOUS) ×6 IMPLANT
SCREW SELF DRILL HT 1.5/4MM (Screw) ×33 IMPLANT
SET CARTRIDGE AND TUBING (SET/KITS/TRAYS/PACK) ×6 IMPLANT
SPECIMEN JAR SMALL (MISCELLANEOUS) ×3 IMPLANT
SPONGE NEURO XRAY DETECT 1X3 (DISPOSABLE) IMPLANT
SPONGE SURGIFOAM ABS GEL 100 (HEMOSTASIS) ×3 IMPLANT
STAPLER VISISTAT 35W (STAPLE) ×3 IMPLANT
SUT ETHILON 3 0 FSL (SUTURE) ×3 IMPLANT
SUT ETHILON 3 0 PS 1 (SUTURE) IMPLANT
SUT MNCRL AB 3-0 PS2 18 (SUTURE) IMPLANT
SUT NURALON 4 0 TR CR/8 (SUTURE) ×9 IMPLANT
SUT SILK 0 TIES 10X30 (SUTURE) IMPLANT
SUT VIC AB 2-0 CP2 18 (SUTURE) ×9 IMPLANT
SYR BULB 3OZ (MISCELLANEOUS) IMPLANT
TIP SHEAR CVD EXTENDED 36KH (INSTRUMENTS) ×6 IMPLANT
TIP STANDARD 36KHZ (INSTRUMENTS) ×3
TIP STD 36KHZ (INSTRUMENTS) ×1 IMPLANT
TOWEL GREEN STERILE (TOWEL DISPOSABLE) ×3 IMPLANT
TOWEL GREEN STERILE FF (TOWEL DISPOSABLE) ×3 IMPLANT
TRAY FOLEY MTR SLVR 16FR STAT (SET/KITS/TRAYS/PACK) ×3 IMPLANT
TUBE CONNECTING 12'X1/4 (SUCTIONS) ×1
TUBE CONNECTING 12X1/4 (SUCTIONS) ×2 IMPLANT
TUBE CONNECTING 20'X1/4 (TUBING) ×1
TUBE CONNECTING 20X1/4 (TUBING) ×2 IMPLANT
UNDERPAD 30X30 (UNDERPADS AND DIAPERS) IMPLANT
WATER STERILE IRR 1000ML POUR (IV SOLUTION) ×3 IMPLANT
WRENCH TORQUE 36KHZ (INSTRUMENTS) ×3 IMPLANT

## 2019-01-23 NOTE — Anesthesia Procedure Notes (Signed)
Arterial Line Insertion Start/End11/07/2018 9:00 AM, 02/04/2019 9:09 AM Performed by: Lavell Luster, CRNA, CRNA  Preanesthetic checklist: patient identified, risks and benefits discussed, surgical consent, monitors and equipment checked, pre-op evaluation and timeout performed Lidocaine 1% used for infiltration Left, radial was placed Catheter size: 20 G Hand hygiene performed , maximum sterile barriers used  and Seldinger technique used Allen's test indicative of satisfactory collateral circulation Attempts: 2 Procedure performed without using ultrasound guided technique. Following insertion, Biopatch and dressing applied. Post procedure assessment: normal  Patient tolerated the procedure well with no immediate complications.

## 2019-01-23 NOTE — Brief Op Note (Signed)
02/01/2019  7:20 PM  PATIENT:  Brandon Henry  33 y.o. male  PRE-OPERATIVE DIAGNOSIS:  BRAIN TUMOR  POST-OPERATIVE DIAGNOSIS:  BRAIN TUMOR  PROCEDURE:  Procedure(s): LEFT ORBITO ZYGOMATIC CRANIOTOMY FOR TUMOR RESECTION WITH BRAINLAB (Left)  SURGEON:  Surgeon(s) and Role:    * Toma Arts, Joyice Faster, MD - Primary    * Kristeen Miss, MD - Assisting  PHYSICIAN ASSISTANT:   ANESTHESIA:   general  EBL:  1880 mL   BLOOD ADMINISTERED:none  DRAINS: Ventriculostomy Drain in the left   LOCAL MEDICATIONS USED:  LIDOCAINE   SPECIMEN:  Source of Specimen:  suprasellar tumor  DISPOSITION OF SPECIMEN:  PATHOLOGY  COUNTS:  YES  TOURNIQUET:  * No tourniquets in log *  DICTATION: .Note written in EPIC  PLAN OF CARE: Admit to inpatient   PATIENT DISPOSITION:  ICU - intubated and hemodynamically stable.   Delay start of Pharmacological VTE agent (>24hrs) due to surgical blood loss or risk of bleeding: yes

## 2019-01-23 NOTE — Anesthesia Procedure Notes (Signed)
Procedure Name: Intubation Date/Time: 01/26/2019 11:00 AM Performed by: Eligha Bridegroom, CRNA Pre-anesthesia Checklist: Patient identified, Emergency Drugs available, Suction available, Patient being monitored and Timeout performed Patient Re-evaluated:Patient Re-evaluated prior to induction Oxygen Delivery Method: Circle system utilized Preoxygenation: Pre-oxygenation with 100% oxygen Induction Type: IV induction Ventilation: Mask ventilation without difficulty Laryngoscope Size: Mac and 4 Grade View: Grade I Tube type: Oral Tube size: 7.5 mm Number of attempts: 1 Airway Equipment and Method: Stylet Placement Confirmation: ETT inserted through vocal cords under direct vision,  positive ETCO2 and breath sounds checked- equal and bilateral Secured at: 23 cm Tube secured with: Tape Dental Injury: Teeth and Oropharynx as per pre-operative assessment

## 2019-01-23 NOTE — Anesthesia Postprocedure Evaluation (Signed)
Anesthesia Post Note  Patient: Brandon Henry  Procedure(s) Performed: LEFT ORBITO ZYGOMATIC CRANIOTOMY FOR TUMOR RESECTION WITH BRAINLAB (Left )     Patient location during evaluation: PACU Anesthesia Type: General Level of consciousness: awake and alert Pain management: pain level controlled Vital Signs Assessment: post-procedure vital signs reviewed and stable Respiratory status: spontaneous breathing, nonlabored ventilation and respiratory function stable Cardiovascular status: blood pressure returned to baseline and stable Postop Assessment: no apparent nausea or vomiting Anesthetic complications: no    Last Vitals:  Vitals:   02/04/2019 2015 01/22/2019 2100  BP: 120/76   Pulse: 66 87  Resp: 17 17  Temp: (!) 36.2 C   SpO2: 98% (!) 89%    Last Pain:  Vitals:   02/01/2019 2000  TempSrc:   PainSc: Asleep                 Catalina Gravel

## 2019-01-23 NOTE — Op Note (Signed)
PATIENT: Brandon Henry  DAY OF SURGERY: 02/01/2019   PRE-OPERATIVE DIAGNOSIS:  Suprasellar skull base tumor   POST-OPERATIVE DIAGNOSIS: Suprasellar skull base tumor   PROCEDURE:  Left orbitozygomatic craniotomy for resection of skull base tumor, use of operating microscope, frameless stereotaxy   SURGEON:  Surgeon(s) and Role:    Judith Part, MD - Primary    Kristeen Miss, MD - Assisting   ANESTHESIA: ETGA   BRIEF HISTORY: This is a 33 year old man who presented with a seizure in the setting of subacute visual loss. 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 After discussion with the patient and his family, given his visual deficits and the new seizures, [name]. His frontal sinus was larger on the left, but his vision was worse on the left, so [name] to maximize the chance of saving his right sided vision as well as decompressing the left optic canal. This was discussed with the patient as well as risks, benefits, and alternatives and wished to proceed with surgery.   OPERATIVE DETAIL: The patient was taken to the operating room and placed on the OR table in the supine position. A formal time out was performed with two patient identifiers and confirmed the operative site. Anesthesia was induced by the anesthesia team. The [place] head holder was applied to the head and a registration array was attached to the [place]. This was co-registered with the patient's preoperative imaging, the fit appeared to be acceptable. Using frameless stereotaxy, the operative trajectory was planned and the incision was marked. Hair was clipped with surgical clippers over the incision and the area was then prepped and draped in a sterile fashion.  A linear incision was placed in the left pterional region from the tragus to midline behind the hairline. Soft tissue dissection was performed to expose the  left zygoma and superolateral orbit. Burr holes were placed including one in the keyhole with exposure of the orbit as well as intracranial space. The periorbita was then dissected superolaterally. A craniotomy was created with a foot plate side cutting drill. Dr. Ellene Route scrubbed in and helped with exposure of the orbit and used a retractor to protect the orbital contents during osteotomies. Osteotomies were then performed lateral to the supraorbital nerve and on the lateral orbit. The osteotomy on the lateral orbit was connected to the burr hole to connect the craniotomy to the orbitotomy. An osteotome was used to disconnect the superior orbit and the bone flap was removed as a one piece modified orbitozygomatic craniotomy flap.  The pterion was drilled flat and optic nerve was decompressed extradurally. The dura was then opened and flapped anteriorly with confirmation of anatomy. The Sylvian fissure was opened to drain CSF. The brain was fairly tight so I extended the durotomy radially with releasing incsions. The tumor was encountered and was not significantly adherent to the dura without a significant dural blood supply. The plane with the cortex was very difficult to appreciate and indeterminate at multiple locations. I internally debulked the tumor to allow for better dissection using navigation assistance to avoid the vascular structures inside the tumor. Both optic nerves were significantly displaced and thinned. Tumor was dissected superficial to deep with difficulty developing a plane with normal cortex and therefore difficulty locating the ACAs. I split the fissure further and followed the MCA on the left into the tumor, but was unable to safely dissect it  free due to adherent tumor. Given the inability to safely dissect out neurovascular structures, I thought the best course of action was the do a maximal safe internal debulking and send specimen to obtain a diagnosis. After further debulking, I was  able to develop a better plane with the inferior frontal lobe, but it was unreliable.   While I was working on either side of the infundibulum, the tumor increased in vascularity. Given the location, I was unable to confidently visualize the source and, given the perforators in this region, did not think it was safe to continue with dissection. I therefore used hemostatic agents to stop the bleeding and confirmed hemostasis along the rest of the surgical bed. The brain was more protuberant at this point, so I reinspected the cavity to make sure there was no hemtaoma present or bleeding. I was unable to reach the tumor cyst, so I used stereotaxy to place a left frontal ventriculostomy to decompress the cyst as well as the ventricles. This was tunneled posteriorly and secured. This allowed for safer closure with less pressure. The dura was reapproximated, and the bone flap was replaced with titanium plates and screws. All instrument and sponge counts were correct, the incision was then closed in layers. The patient was then returned to anesthesia for emergence.    EBL:  188mL   DRAINS: none   SPECIMENS: Suprasellar tumor   Judith Part, MD 01/21/2019 10:28 AM

## 2019-01-23 NOTE — Transfer of Care (Signed)
Immediate Anesthesia Transfer of Care Note  Patient: Retta Mac  Procedure(s) Performed: LEFT ORBITO ZYGOMATIC CRANIOTOMY FOR TUMOR RESECTION WITH BRAINLAB (Left )  Patient Location: PACU  Anesthesia Type:General  Level of Consciousness: awake and alert   Airway & Oxygen Therapy: Patient Spontanous Breathing  Post-op Assessment: Report given to RN and Post -op Vital signs reviewed and stable  Post vital signs: Reviewed and stable  Last Vitals:  Vitals Value Taken Time  BP 159/89 02/03/2019 1925  Temp    Pulse 81 01/26/2019 1930  Resp 18 01/19/2019 1930  SpO2 96 % 01/19/2019 1930  Vitals shown include unvalidated device data.  Last Pain:  Vitals:   01/20/2019 0807  TempSrc: Oral  PainSc:          Complications: No apparent anesthesia complications

## 2019-01-23 NOTE — Progress Notes (Signed)
Neurosurgery Service Post-operative progress note  Assessment & Plan: 33 y.o. man s/p L OZ for suprasellar tumor. Seen in PACU, FCx4 and answering simple questions, not able to reliably check visual acuity.  -admit to 4N -EVD/IVC in place, keep at +10 above head level -post op [facility] -MRI tomorrow  Judith Part  02/01/2019 7:47 PM

## 2019-01-23 NOTE — Progress Notes (Signed)
Pt stated he cannot see out of either eye at this time. [facility] and [facility] notified. STAT head CT ordered.  WCTM

## 2019-01-23 NOTE — Progress Notes (Signed)
Transported patient to Short stay rm 36 via stretcher, on room air, alert & oriented x4 with stable VS, w/ heplock G22 on left hand;pt. on tele. -CHG done prior to transport -Consent to be taken. -Report given to RN

## 2019-01-24 ENCOUNTER — Other Ambulatory Visit: Payer: Self-pay | Admitting: Radiation Therapy

## 2019-01-24 ENCOUNTER — Encounter (HOSPITAL_COMMUNITY): Payer: Self-pay | Admitting: Neurological Surgery

## 2019-01-24 LAB — POCT I-STAT, CHEM 8
BUN: 10 mg/dL (ref 6–20)
BUN: 11 mg/dL (ref 6–20)
Calcium, Ion: 1.19 mmol/L (ref 1.15–1.40)
Calcium, Ion: 1.17 mmol/L (ref 1.15–1.40)
Chloride: 104 mmol/L (ref 98–111)
Chloride: 104 mmol/L (ref 98–111)
Creatinine, Ser: 0.7 mg/dL (ref 0.61–1.24)
Creatinine, Ser: 0.8 mg/dL (ref 0.61–1.24)
Glucose, Bld: 196 mg/dL — ABNORMAL HIGH (ref 70–99)
Glucose, Bld: 239 mg/dL — ABNORMAL HIGH (ref 70–99)
HCT: 32 % — ABNORMAL LOW (ref 39.0–52.0)
HCT: 27 % — ABNORMAL LOW (ref 39.0–52.0)
Hemoglobin: 10.9 g/dL — ABNORMAL LOW (ref 13.0–17.0)
Hemoglobin: 9.2 g/dL — ABNORMAL LOW (ref 13.0–17.0)
Potassium: 4.6 mmol/L (ref 3.5–5.1)
Potassium: 4.9 mmol/L (ref 3.5–5.1)
Sodium: 136 mmol/L (ref 135–145)
Sodium: 137 mmol/L (ref 135–145)
TCO2: 24 mmol/L (ref 22–32)
TCO2: 25 mmol/L (ref 22–32)

## 2019-01-24 LAB — POCT I-STAT 7, (LYTES, BLD GAS, ICA,H+H)
Acid-base deficit: 2 mmol/L (ref 0.0–2.0)
Bicarbonate: 23.2 mmol/L (ref 20.0–28.0)
Calcium, Ion: 1.15 mmol/L (ref 1.15–1.40)
HCT: 31 % — ABNORMAL LOW (ref 39.0–52.0)
Hemoglobin: 10.5 g/dL — ABNORMAL LOW (ref 13.0–17.0)
O2 Saturation: 100 %
Patient temperature: 36.5
Potassium: 4.9 mmol/L (ref 3.5–5.1)
Sodium: 137 mmol/L (ref 135–145)
TCO2: 24 mmol/L (ref 22–32)
pCO2 arterial: 39.8 mmHg (ref 32.0–48.0)
pH, Arterial: 7.373 (ref 7.350–7.450)
pO2, Arterial: 271 mmHg — ABNORMAL HIGH (ref 83.0–108.0)

## 2019-01-24 LAB — GLUCOSE, CAPILLARY
Glucose-Capillary: 229 mg/dL — ABNORMAL HIGH (ref 70–99)
Glucose-Capillary: 223 mg/dL — ABNORMAL HIGH (ref 70–99)
Glucose-Capillary: 209 mg/dL — ABNORMAL HIGH (ref 70–99)
Glucose-Capillary: 185 mg/dL — ABNORMAL HIGH (ref 70–99)
Glucose-Capillary: 200 mg/dL — ABNORMAL HIGH (ref 70–99)
Glucose-Capillary: 183 mg/dL — ABNORMAL HIGH (ref 70–99)

## 2019-01-24 LAB — CREATININE, SERUM
Creatinine, Ser: 0.87 mg/dL (ref 0.61–1.24)
GFR calc Af Amer: >60 mL/min (ref >60)
GFR calc non Af Amer: >60 mL/min (ref >60)

## 2019-01-24 LAB — CBC
HCT: 29.9 % — ABNORMAL LOW (ref 39.0–52.0)
Hemoglobin: 10.6 g/dL — ABNORMAL LOW (ref 13.0–17.0)
MCH: 32.1 pg (ref 26.0–34.0)
MCHC: 35.5 g/dL (ref 30.0–36.0)
MCV: 90.6 fL (ref 80.0–100.0)
Platelets: 144 10*3/uL — ABNORMAL LOW (ref 150–400)
RBC: 3.3 MIL/uL — ABNORMAL LOW (ref 4.22–5.81)
RDW: 11.9 % (ref 11.5–15.5)
WBC: 11.3 10*3/uL — ABNORMAL HIGH (ref 4.0–10.5)
nRBC: 0 % (ref 0.0–0.2)

## 2019-01-24 LAB — HEMOGLOBIN A1C
Hgb A1c MFr Bld: 7.5 % — ABNORMAL HIGH (ref 4.8–5.6)
Mean Plasma Glucose: 168.55 mg/dL

## 2019-01-24 MED ORDER — LEVETIRACETAM IN NACL 500 MG/100ML IV SOLN
500.0000 mg | Freq: Two times a day (BID) | INTRAVENOUS | Status: DC
  Administered 2019-01-24 (×2): 500 mg via INTRAVENOUS
  Filled 2019-01-24 (×2): qty 100

## 2019-01-24 MED ORDER — INSULIN ASPART 100 UNIT/ML ~~LOC~~ SOLN
0.0000 [IU] | SUBCUTANEOUS | Status: DC
  Administered 2019-01-24 (×2): 5 [IU] via SUBCUTANEOUS
  Administered 2019-01-24 (×2): 3 [IU] via SUBCUTANEOUS
  Administered 2019-01-25: 04:00:00 5 [IU] via SUBCUTANEOUS
  Administered 2019-01-25: 3 [IU] via SUBCUTANEOUS
  Administered 2019-01-25: 8 [IU] via SUBCUTANEOUS

## 2019-01-24 NOTE — Progress Notes (Signed)
Inpatient Diabetes Program Recommendations  AACE/ADA: New Consensus Statement on Inpatient Glycemic Control (2015)  Target Ranges:  Prepandial:   less than 140 mg/dL      Peak postprandial:   less than 180 mg/dL (1-2 hours)      Critically ill patients:  140 - 180 mg/dL   Lab Results  Component Value Date   GLUCAP 209 (H) 01/24/2019   HGBA1C 7.5 (H) 01/24/2019    Review of Glycemic Control  Diabetes history: DM2 Outpatient Diabetes medications: 70/30 20 units bid, metformin 500 mg bid Current orders for Inpatient glycemic control: Novolog 0-15 units Q4H, metformin 500 mg bid  HgbA1C - 7.5% Blood sugars today - 229, 223, 209 Needs part of basal insulin from his home 70/30.  Inpatient Diabetes Program Recommendations:     Add Lantus 12 units QHS  Will continue to follow.  Thank you. Lorenda Peck, RD, LDN, CDE Inpatient Diabetes Coordinator [phone]

## 2019-01-24 NOTE — Progress Notes (Signed)
Neurosurgery Service Progress Note  Subjective: NAE ON   Objective: Vitals:   01/24/19 0500 01/24/19 0600 01/24/19 0700 01/24/19 0725  BP:  (!) 148/88 107/76   Pulse: (!) 101 73 77   Resp: 16 17 14    Temp:    99 F (37.2 C)  TempSrc:    Oral  SpO2: 100% 100% 100%   Weight:      Height:       Temp (24hrs), Avg:98.2 F (36.8 C), Min:97 F (36.1 C), Max:100.3 F (37.9 C)  CBC Latest Ref Rng & Units 01/16/2019 02/03/2019 03/12/2017  WBC 4.0 - 10.5 K/uL 11.3(H) 5.6 -  Hemoglobin 13.0 - 17.0 g/dL 10.6(L) 14.7 14.3  Hematocrit 39.0 - 52.0 % 29.9(L) 43.1 42.0  Platelets 150 - 400 K/uL 144(L) 257 -   BMP Latest Ref Rng & Units 01/13/2019 01/18/2019 03/12/2017  Glucose 70 - 99 mg/dL - 143(H) 352(H)  BUN 6 - 20 mg/dL - 17 13  Creatinine 0.61 - 1.24 mg/dL 0.87 0.90 0.80  Sodium 135 - 145 mmol/L - 137 140  Potassium 3.5 - 5.1 mmol/L - 4.2 3.9  Chloride 98 - 111 mmol/L - 99 99(L)  CO2 22 - 32 mmol/L - 26 -  Calcium 8.9 - 10.3 mg/dL - 9.8 -    Intake/Output Summary (Last 24 hours) at 01/24/2019 0759 Last data filed at 01/24/2019 [id number] Gross per 24 hour  Intake 3000 ml  Output 6160 ml  Net -3160 ml    Current Facility-Administered Medications:  .  0.9 %  sodium chloride infusion, , Intravenous, Continuous, Meyran, Ocie Cornfield, NP, Last Rate: 50 mL/hr at 01/21/19 1838 .  0.9 %  sodium chloride infusion, , Intravenous, Continuous, Brennan Bailey, MD, Last Rate: 10 mL/hr at 02/09/2019 0909 .  acetaminophen (TYLENOL) tablet 650 mg, 650 mg, Oral, Q6H PRN **OR** acetaminophen (TYLENOL) suppository 650 mg, 650 mg, Rectal, Q6H PRN, Judith Part, MD .  atorvastatin (LIPITOR) tablet 20 mg, 20 mg, Oral, Daily, Hooper Petteway, Joyice Faster, MD, 20 mg at 01/22/19 0941 .  Chlorhexidine Gluconate Cloth 2 % PADS 6 each, 6 each, Topical, Daily, Judith Part, MD, 6 each at 01/17/2019 2200 .  docusate sodium (COLACE) capsule 100 mg, 100 mg, Oral, BID, Judith Part, MD, 100 mg at  01/22/19 2210 .  [START ON 01/16/2019] heparin injection 5,000 Units, 5,000 Units, Subcutaneous, Q8H, Seena Ritacco A, MD .  HYDROcodone-acetaminophen (NORCO/VICODIN) 5-325 MG per tablet 1-2 tablet, 1-2 tablet, Oral, Q4H PRN, Gwendalynn Eckstrom A, MD .  insulin aspart (novoLOG) injection 0-15 Units, 0-15 Units, Subcutaneous, Q4H, Judith Part, MD, 5 Units at 01/24/19 0746 .  labetalol (NORMODYNE) injection 10-40 mg, 10-40 mg, Intravenous, Q10 min PRN, Judith Part, MD .  levETIRAcetam (KEPPRA) tablet 500 mg, 500 mg, Oral, BID, Judith Part, MD, 500 mg at 01/22/19 2210 .  lisinopril (ZESTRIL) tablet 2.5 mg, 2.5 mg, Oral, Daily, Mikell Kazlauskas A, MD, 2.5 mg at 01/22/19 0940 .  loratadine (CLARITIN) tablet 10 mg, 10 mg, Oral, Daily, Jaymarion Trombly, Joyice Faster, MD, 10 mg at 01/22/19 0940 .  metFORMIN (GLUCOPHAGE) tablet 500 mg, 500 mg, Oral, BID WC, Corbet Hanley, Joyice Faster, MD, 500 mg at 01/22/19 0940 .  ondansetron (ZOFRAN) tablet 4 mg, 4 mg, Oral, Q6H PRN **OR** ondansetron (ZOFRAN) injection 4 mg, 4 mg, Intravenous, Q6H PRN, Azarie Coriz A, MD .  polyethylene glycol (MIRALAX / GLYCOLAX) packet 17 g, 17 g, Oral, Daily PRN, Judith Part, MD .  senna (  SENOKOT) tablet 8.6 mg, 1 tablet, Oral, BID, Judith Part, MD, 8.6 mg at 01/22/19 2210   Physical Exam: Somnolent but interactive, Ox1, FCx4 briskly, eye swollen shut OS, gaze neutral OD, confusion limits visual acuity evaluation, but pt appears to be NLP   Assessment & Plan: 33 y.o. man w/ new onset Sz in the setting of progressive visual loss and confusion. MRI shows cystic enhancing suprasellar mass L>R encasing the ICAs/MCAs/ACAs, extending past the cavernous sinus on the L, multiple cystic foci with large left frontal cystic structure, optic nerves compressed and displaced, infundibulum is midline and displaced. 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 11/12 s/p L OZ  craniotomy for tumor resection w/ EVD placement, intra-op, tumor appeared to be infiltrating chiasm & arising from the 3rd, not attached to skull base, partial resection performed. 11/12 post-op CTH w/ resection bed hemorrhage without significant mass effect, new IVH with EVD in place  -path pending -keep EVD at +10 above head level, will start weaning in POD3 -swallow eval -uncooperative with CT, will hold off on post-op MRI until pt improves clinically, known subtotal resection -restart insulin sliding scale -cont levitiracetam 500mg  bid for preop seizure -SCDs/TEDs, hold SQH until 11/14  Marcello Moores A Laisha Rau  01/24/19 7:59 AM

## 2019-01-24 NOTE — Progress Notes (Signed)
Patient ID: Brandon Henry, male   DOB: 02-Oct-1985, 33 y.o.   MRN: [id number] Patient noted no vision in right eye. CT post op shows decompression cavity with some blood and air within it.Ventricular hemmorhage noted and IVC placed. No surgical process noted, considering nature and location of tumor. Post op scan is baseline for comparison.

## 2019-01-24 NOTE — Evaluation (Signed)
Physical Therapy Evaluation Patient Details Name: Brandon Henry MRN: [id number] DOB: 1985/06/20 Today's Date: 01/24/2019   History of Present Illness  Pt is a 33 yo male s/p seizure and vision loss resulting in L orbitozygomatic craniotomy for tumor resection. PMHx: T2DM  Clinical Impression  Pt admitted with/for brain mass, s/p tumor resection.  Pt needing moderate to light maximal assist from basic mobility.  Pt currently limited functionally due to the problems listed. ( See problems list.)   Pt will benefit from PT to maximize function and safety in order to get ready for next venue listed below.     Follow Up Recommendations CIR    Equipment Recommendations  Other (comment)(TBA)    Recommendations for Other Services Rehab consult     Precautions / Restrictions Precautions Precautions: Fall Precaution Comments: IVD Restrictions Weight Bearing Restrictions: No      Mobility  Bed Mobility Overal bed mobility: Needs Assistance Bed Mobility: Sidelying to Sit;Rolling Rolling: Max assist;+2 for safety/equipment;+2 for physical assistance Sidelying to sit: Max assist;+2 for physical assistance;+2 for safety/equipment       General bed mobility comments: assist for trunk and BLEs  Transfers Overall transfer level: Needs assistance Equipment used: 2 person hand held assist Transfers: Sit to/from Stand Sit to Stand: Mod assist;Max assist;+2 physical assistance;+2 safety/equipment;From elevated surface         General transfer comment: Pt with LOB episode with LLE buckling when attempting to take steps.  Ambulation/Gait             General Gait Details: transfer to chair only  Stairs            Wheelchair Mobility    Modified Rankin (Stroke Patients Only)       Balance Overall balance assessment: Needs assistance   Sitting balance-Leahy Scale: Poor     Standing balance support: Bilateral upper extremity supported Standing balance-Leahy Scale:  Poor Standing balance comment: LLE buckling and requiring immediate seated rest break                             Pertinent Vitals/Pain Pain Assessment: 0-10 Pain Score: 10-Worst pain ever Pain Location: head Pain Descriptors / Indicators: Aching Pain Intervention(s): Monitored during session    Home Living Family/patient expects to be discharged to:: Private residence Living Arrangements: Spouse/significant other Available Help at Discharge: Family;Available 24 hours/day(mom and wife) Type of Home: House Home Access: Stairs to enter Entrance Stairs-Rails: None Entrance Stairs-Number of Steps: 3 Home Layout: One level Home Equipment: None Additional Comments: Mother and Spouse plan to provide 24/7    Prior Function Level of Independence: Independent         Comments: med tech at [employer]     [employer]   Dominant Hand: Right    Extremity/Trunk Assessment   Upper Extremity Assessment Upper Extremity Assessment: Generalized weakness;RUE deficits/detail;LUE deficits/detail RUE Deficits / Details: edema, AROM, WFLs RUE Coordination: decreased fine motor LUE Deficits / Details: edema, AROM, WFLs LUE Coordination: decreased fine motor    Lower Extremity Assessment Lower Extremity Assessment: Overall WFL for tasks assessed(general proximal weakness L >R LE)    Cervical / Trunk Assessment Cervical / Trunk Assessment: Normal  Communication   Communication: No difficulties  Cognition Arousal/Alertness: Lethargic Behavior During Therapy: WFL for tasks assessed/performed Overall Cognitive Status: Impaired/Different from baseline Area of Impairment: Orientation;Attention                 Orientation Level:  Time;Situation Current Attention Level: Sustained           General Comments: Pt very lethargic; repeating that his place of work for every answer.      General Comments General comments (skin integrity, edema, etc.): Pt's mother  in room. Education on 5-10 mins every few hours to engage with pt and be quiet; rest of the time to allow pt to sleep.    Exercises     Assessment/Plan    PT Assessment Patient needs continued PT services  PT Problem List Decreased strength;Decreased activity tolerance;Decreased balance;Decreased mobility;Decreased coordination;Pain       PT Treatment Interventions Gait training;DME instruction;Functional mobility training;Therapeutic activities;Balance training;Neuromuscular re-education;Patient/family education    PT Goals (Current goals can be found in the Care Plan section)  Acute Rehab PT Goals Patient Stated Goal: pt's mom wants what is best for him PT Goal Formulation: With patient Time For Goal Achievement: 02/07/19 Potential to Achieve Goals: Good    Frequency Min 4X/week   Barriers to discharge        Co-evaluation PT/OT/SLP Co-Evaluation/Treatment: Yes Reason for Co-Treatment: Complexity of the patient's impairments (multi-system involvement);To address functional/ADL transfers PT goals addressed during session: Mobility/safety with mobility OT goals addressed during session: ADL's and self-care       AM-PAC PT "6 Clicks" Mobility  Outcome Measure Help needed turning from your back to your side while in a flat bed without using bedrails?: A Lot Help needed moving from lying on your back to sitting on the side of a flat bed without using bedrails?: A Lot Help needed moving to and from a bed to a chair (including a wheelchair)?: A Lot Help needed standing up from a chair using your arms (e.g., wheelchair or bedside chair)?: A Lot Help needed to walk in hospital room?: A Lot Help needed climbing 3-5 steps with a railing? : A Lot 6 Click Score: 12    End of Session   Activity Tolerance: Patient tolerated treatment well;Patient limited by fatigue Patient left: in chair;with call bell/phone within reach;with chair alarm set;with family/visitor present Nurse  Communication: Mobility status PT Visit Diagnosis: Other abnormalities of gait and mobility (R26.89);Unsteadiness on feet (R26.81);Difficulty in walking, not elsewhere classified (R26.2);Pain Pain - part of body: (Head)    Time: 1210-1240 PT Time Calculation (min) (ACUTE ONLY): 30 min   Charges:   PT Evaluation $PT Eval Moderate Complexity: 1 Mod          01/24/2019  Donnella Sham, PT Acute Rehabilitation Services [phone]  (pager) [phone]  (office)  Tessie Fass Harolyn Cocker 01/24/2019, 7:17 PM

## 2019-01-24 NOTE — Evaluation (Signed)
Occupational Therapy Evaluation Patient Details Name: Brandon Henry MRN: [id number] DOB: April 30, 1985 Today's Date: 01/24/2019    History of Present Illness Pt is a 33 yo male s/p seizure and vision loss resulting in L orbitozygomatic craniotomy for tumor resection. PMHx: T2DM   Clinical Impression   Pt PTA: Pt living with spouse and independent prior. Pt currently limited by R eye without vision and L eye swollen nearly shut; edema in BUEs; poor memory and attention and poor activity tolerance. Pt maxA +2 for guidance through bed mobility and modA to maxA+2 for transfers.  Pt with LOB episode with LLE buckling when attempting to take steps. Pt returned to bed as pt felt dizzy in standing, VSS, but unable to obtain BP in standing. Pt's mother in room for support. Education on 5-10 mins every few hours to engage with pt and be quiet; rest of the time to allow pt to sleep. Pt would greatly benefit from continued OT skilled services for ADL, mobility and safety in CIR setting. OT to follow acutely.      Follow Up Recommendations  CIR;Supervision/Assistance - 24 hour    Equipment Recommendations  Other (comment)(to be determined)    Recommendations for Other Services Rehab consult     Precautions / Restrictions Precautions Precautions: Fall Precaution Comments: IVD Restrictions Weight Bearing Restrictions: No      Mobility Bed Mobility Overal bed mobility: Needs Assistance Bed Mobility: Sidelying to Sit;Rolling Rolling: Max assist;+2 for safety/equipment;+2 for physical assistance Sidelying to sit: Max assist;+2 for physical assistance;+2 for safety/equipment       General bed mobility comments: assist for trunk and BLEs  Transfers Overall transfer level: Needs assistance Equipment used: 2 person hand held assist Transfers: Sit to/from Stand Sit to Stand: Mod assist;Max assist;+2 physical assistance;+2 safety/equipment;From elevated surface         General transfer  comment: Pt with LOB episode with LLE buckling when attempting to take steps.    Balance Overall balance assessment: Needs assistance   Sitting balance-Leahy Scale: Poor     Standing balance support: Bilateral upper extremity supported Standing balance-Leahy Scale: Poor Standing balance comment: LLE buckling and requiring immediate seated rest break                           ADL either performed or assessed with clinical judgement   ADL Overall ADL's : Needs assistance/impaired Eating/Feeding: Maximal assistance;Bed level;Sitting   Grooming: Maximal assistance;Sitting;Bed level   Upper Body Bathing: Maximal assistance;Sitting;Bed level   Lower Body Bathing: Total assistance;Sitting/lateral leans;Bed level   Upper Body Dressing : Maximal assistance;Sitting;Bed level   Lower Body Dressing: Maximal assistance;Total assistance;+2 for physical assistance;+2 for safety/equipment;Sitting/lateral leans;Sit to/from stand   Toilet Transfer: Moderate assistance;Maximal assistance;+2 for physical assistance;+2 for safety/equipment;Stand-pivot;Cueing for sequencing;BSC Toilet Transfer Details (indicate cue type and reason): LLE not pivoting well with transfer to recliner         Functional mobility during ADLs: Moderate assistance;Maximal assistance;+2 for physical assistance;+2 for safety/equipment;Cueing for safety;Cueing for sequencing General ADL Comments: Pt limited by R eye without vision and L eye swollen nearly shut; edema in BUEs; poor memory and attention and poor activity tolerance     Vision Baseline Vision/History: No visual deficits Patient Visual Report: Other (comment)(R eye blindness) Vision Assessment?: Vision impaired- to be further tested in functional context Additional Comments: R eye blindness; L eye swollen shut     Perception     Praxis  Pertinent Vitals/Pain Pain Assessment: 0-10 Pain Score: 10-Worst pain ever Pain Location: head Pain  Descriptors / Indicators: Aching Pain Intervention(s): Monitored during session     Hand Dominance Right   Extremity/Trunk Assessment Upper Extremity Assessment Upper Extremity Assessment: Generalized weakness;RUE deficits/detail;LUE deficits/detail RUE Deficits / Details: edema, AROM, WFLs RUE Coordination: decreased fine motor LUE Deficits / Details: edema, AROM, WFLs LUE Coordination: decreased fine motor   Lower Extremity Assessment Lower Extremity Assessment: Defer to PT evaluation   Cervical / Trunk Assessment Cervical / Trunk Assessment: Normal   Communication Communication Communication: No difficulties   Cognition Arousal/Alertness: Lethargic Behavior During Therapy: WFL for tasks assessed/performed Overall Cognitive Status: Impaired/Different from baseline Area of Impairment: Orientation;Attention                 Orientation Level: Time;Situation Current Attention Level: Sustained           General Comments: Pt very lethargic; repeating that his place of work for every answer.   General Comments  Pt's mother in room. Education on 5-10 mins every few hours to engage with pt and be quiet; rest of the time to allow pt to sleep.    Exercises     Shoulder Instructions      Home Living Family/patient expects to be discharged to:: Private residence Living Arrangements: Spouse/significant other Available Help at Discharge: Family;Available 24 hours/day(mom and wife) Type of Home: House Home Access: Stairs to enter [employer] of Steps: 3 Entrance Stairs-Rails: None Home Layout: One level     Bathroom Shower/Tub: Teacher, early years/pre: Standard     Home Equipment: None   Additional Comments: Mother and Spouse plan to provide 24/7      Prior Functioning/Environment Level of Independence: Independent        Comments: med tech at [employer]        OT Problem List: Decreased strength;Decreased activity  tolerance;Decreased cognition;Pain;Impaired UE functional use;Increased edema;Decreased safety awareness;Decreased coordination;Impaired balance (sitting and/or standing);Impaired vision/perception      OT Treatment/Interventions: Self-care/ADL training;Therapeutic exercise;Neuromuscular education;Energy conservation;DME and/or AE instruction;Therapeutic activities;Cognitive remediation/compensation;Visual/perceptual remediation/compensation;Patient/family education;Balance training    OT Goals(Current goals can be found in the care plan section) Acute Rehab OT Goals Patient Stated Goal: pt's mom wants what is best for him OT Goal Formulation: Patient unable to participate in goal setting Time For Goal Achievement: 02/07/19 Potential to Achieve Goals: Good ADL Goals Pt Will Perform Grooming: with min guard assist;standing Pt Will Perform Upper Body Dressing: with supervision;sitting Pt Will Perform Lower Body Dressing: with min guard assist;sit to/from stand Pt Will Transfer to Toilet: with min guard assist;ambulating;bedside commode Pt/caregiver will Perform Home Exercise Program: Increased strength;Right Upper extremity;Left upper extremity;With minimal assist Additional ADL Goal #1: Pt will tolerate x10 mins of visual scanning/percpetion exercises for increasing function in eyes.  OT Frequency: Min 3X/week   Barriers to D/C:            Co-evaluation PT/OT/SLP Co-Evaluation/Treatment: Yes Reason for Co-Treatment: Complexity of the patient's impairments (multi-system involvement);To address functional/ADL transfers   OT goals addressed during session: ADL's and self-care      AM-PAC OT "6 Clicks" Daily Activity     Outcome Measure Help from another person eating meals?: Total Help from another person taking care of personal grooming?: A Lot Help from another person toileting, which includes using toliet, bedpan, or urinal?: Total Help from another person bathing (including  washing, rinsing, drying)?: Total Help from another person to put on and taking off  regular upper body clothing?: A Lot Help from another person to put on and taking off regular lower body clothing?: Total 6 Click Score: 8   End of Session Nurse Communication: Mobility status  Activity Tolerance: Treatment limited secondary to medical complications (Comment);Patient limited by lethargy Patient left: in chair;with call bell/phone within reach;with chair alarm set;with family/visitor present  OT Visit Diagnosis: Unsteadiness on feet (R26.81);Muscle weakness (generalized) (M62.81);Pain Pain - Right/Left: Right Pain - part of body: (head)                Time: [id number] OT Time Calculation (min): 33 min Charges:  OT General Charges $OT Visit: 1 Visit OT Evaluation $OT Eval Moderate Complexity: 1 Mod  Darryl Nestle) Marsa Aris OTR/L Acute Rehabilitation Services Pager: [phone] Office: [place] 01/24/2019, 5:07 PM

## 2019-01-25 ENCOUNTER — Inpatient Hospital Stay (HOSPITAL_COMMUNITY): Payer: Self-pay | Admitting: Certified Registered"

## 2019-01-25 ENCOUNTER — Inpatient Hospital Stay (HOSPITAL_COMMUNITY): Payer: Self-pay

## 2019-01-25 ENCOUNTER — Encounter (HOSPITAL_COMMUNITY): Admission: EM | Disposition: E | Payer: Self-pay | Source: Home / Self Care | Attending: Neurological Surgery

## 2019-01-25 DIAGNOSIS — R4182 Altered mental status, unspecified: Secondary | ICD-10-CM | POA: Diagnosis not present

## 2019-01-25 HISTORY — PX: CRANIOTOMY: SHX93

## 2019-01-25 LAB — POCT I-STAT 7, (LYTES, BLD GAS, ICA,H+H)
Acid-base deficit: 2 mmol/L (ref 0.0–2.0)
Bicarbonate: 23.7 mmol/L (ref 20.0–28.0)
Calcium, Ion: 1.3 mmol/L (ref 1.15–1.40)
HCT: 38 % — ABNORMAL LOW (ref 39.0–52.0)
Hemoglobin: 12.9 g/dL — ABNORMAL LOW (ref 13.0–17.0)
O2 Saturation: 100 %
Patient temperature: 97.6
Potassium: 5.3 mmol/L — ABNORMAL HIGH (ref 3.5–5.1)
Sodium: 148 mmol/L — ABNORMAL HIGH (ref 135–145)
TCO2: 25 mmol/L (ref 22–32)
pCO2 arterial: 44 mmHg (ref 32.0–48.0)
pH, Arterial: 7.336 — ABNORMAL LOW (ref 7.350–7.450)
pO2, Arterial: 189 mmHg — ABNORMAL HIGH (ref 83.0–108.0)

## 2019-01-25 LAB — POCT I-STAT, CHEM 8
BUN: 8 mg/dL (ref 6–20)
Calcium, Ion: 1.22 mmol/L (ref 1.15–1.40)
Chloride: 107 mmol/L (ref 98–111)
Creatinine, Ser: 0.6 mg/dL — ABNORMAL LOW (ref 0.61–1.24)
Glucose, Bld: 270 mg/dL — ABNORMAL HIGH (ref 70–99)
HCT: 36 % — ABNORMAL LOW (ref 39.0–52.0)
Hemoglobin: 12.2 g/dL — ABNORMAL LOW (ref 13.0–17.0)
Potassium: 4.9 mmol/L (ref 3.5–5.1)
Sodium: 143 mmol/L (ref 135–145)
TCO2: 27 mmol/L (ref 22–32)

## 2019-01-25 LAB — GLUCOSE, CAPILLARY
Glucose-Capillary: 220 mg/dL — ABNORMAL HIGH (ref 70–99)
Glucose-Capillary: 258 mg/dL — ABNORMAL HIGH (ref 70–99)
Glucose-Capillary: 245 mg/dL — ABNORMAL HIGH (ref 70–99)
Glucose-Capillary: 263 mg/dL — ABNORMAL HIGH (ref 70–99)
Glucose-Capillary: 250 mg/dL — ABNORMAL HIGH (ref 70–99)
Glucose-Capillary: 228 mg/dL — ABNORMAL HIGH (ref 70–99)

## 2019-01-25 LAB — SODIUM: Sodium: 139 mmol/L (ref 135–145)

## 2019-01-25 SURGERY — CRANIOTOMY TUMOR EXCISION
Anesthesia: General | Site: Head | Laterality: Left

## 2019-01-25 MED ORDER — DEXTROSE 50 % IV SOLN: 25.0000 mL | INTRAVENOUS | Status: DC | PRN

## 2019-01-25 MED ORDER — BUPIVACAINE-EPINEPHRINE 0.5% -1:200000 IJ SOLN
INTRAMUSCULAR | Status: AC
  Filled 2019-01-25: qty 1

## 2019-01-25 MED ORDER — MICROFIBRILLAR COLL HEMOSTAT EX POWD
CUTANEOUS | Status: DC | PRN
  Administered 2019-01-25: 5 g via TOPICAL

## 2019-01-25 MED ORDER — ROCURONIUM BROMIDE 10 MG/ML (PF) SYRINGE
PREFILLED_SYRINGE | INTRAVENOUS | Status: AC
  Filled 2019-01-25: qty 10

## 2019-01-25 MED ORDER — PHENYLEPHRINE HCL (PRESSORS) 10 MG/ML IV SOLN
INTRAVENOUS | Status: DC | PRN
  Administered 2019-01-25 (×2): 80 ug via INTRAVENOUS

## 2019-01-25 MED ORDER — LABETALOL HCL 5 MG/ML IV SOLN: 10.0000 mg | INTRAVENOUS | Status: DC | PRN

## 2019-01-25 MED ORDER — DEXAMETHASONE SODIUM PHOSPHATE 10 MG/ML IJ SOLN
INTRAMUSCULAR | Status: AC
  Filled 2019-01-25: qty 1

## 2019-01-25 MED ORDER — INSULIN REGULAR BOLUS VIA INFUSION
0.0000 [IU] | Freq: Three times a day (TID) | INTRAVENOUS | Status: DC
  Filled 2019-01-25: qty 10

## 2019-01-25 MED ORDER — INSULIN ASPART 100 UNIT/ML ~~LOC~~ SOLN
0.0000 [IU] | SUBCUTANEOUS | Status: DC
  Administered 2019-01-25: 11 [IU] via SUBCUTANEOUS
  Administered 2019-01-25 – 2019-01-26 (×5): 7 [IU] via SUBCUTANEOUS
  Administered 2019-01-26: 11 [IU] via SUBCUTANEOUS
  Administered 2019-01-26 (×2): 7 [IU] via SUBCUTANEOUS
  Administered 2019-01-27: 01:00:00 11 [IU] via SUBCUTANEOUS

## 2019-01-25 MED ORDER — LEVETIRACETAM IN NACL 500 MG/100ML IV SOLN
500.0000 mg | Freq: Two times a day (BID) | INTRAVENOUS | Status: DC
  Administered 2019-01-25 – 2019-01-27 (×4): 500 mg via INTRAVENOUS
  Filled 2019-01-25 (×4): qty 100

## 2019-01-25 MED ORDER — INSULIN REGULAR(HUMAN) IN NACL 100-0.9 UT/100ML-% IV SOLN
INTRAVENOUS | Status: DC
  Filled 2019-01-25: qty 100

## 2019-01-25 MED ORDER — BUPIVACAINE-EPINEPHRINE 0.5% -1:200000 IJ SOLN
INTRAMUSCULAR | Status: DC | PRN
  Administered 2019-01-25: 10 mL

## 2019-01-25 MED ORDER — SODIUM CHLORIDE 0.9 % IV SOLN
INTRAVENOUS | Status: DC | PRN
  Administered 2019-01-25: 09:00:00 via INTRAVENOUS

## 2019-01-25 MED ORDER — BACITRACIN ZINC 500 UNIT/GM EX OINT
TOPICAL_OINTMENT | CUTANEOUS | Status: DC | PRN
  Administered 2019-01-25: 1 via TOPICAL

## 2019-01-25 MED ORDER — GLYCOPYRROLATE 0.2 MG/ML IJ SOLN: INTRAMUSCULAR | Status: DC | PRN

## 2019-01-25 MED ORDER — DEXAMETHASONE SODIUM PHOSPHATE 10 MG/ML IJ SOLN
INTRAMUSCULAR | Status: DC | PRN
  Administered 2019-01-25: 10 mg via INTRAVENOUS

## 2019-01-25 MED ORDER — SODIUM CHLORIDE 0.9 % IV SOLN: INTRAVENOUS | Status: DC

## 2019-01-25 MED ORDER — PANTOPRAZOLE SODIUM 40 MG IV SOLR
40.0000 mg | Freq: Every day | INTRAVENOUS | Status: DC
  Administered 2019-01-25 – 2019-01-26 (×2): 40 mg via INTRAVENOUS
  Filled 2019-01-25 (×2): qty 40

## 2019-01-25 MED ORDER — MORPHINE SULFATE (PF) 2 MG/ML IV SOLN: 1.0000 mg | INTRAVENOUS | Status: DC | PRN

## 2019-01-25 MED ORDER — ONDANSETRON HCL 4 MG/2ML IJ SOLN
4.0000 mg | INTRAMUSCULAR | Status: DC | PRN
  Administered 2019-01-26: 4 mg via INTRAVENOUS
  Filled 2019-01-25: qty 2

## 2019-01-25 MED ORDER — ONDANSETRON HCL 4 MG PO TABS: 4.0000 mg | ORAL_TABLET | ORAL | Status: DC | PRN

## 2019-01-25 MED ORDER — THROMBIN 20000 UNITS EX SOLR
CUTANEOUS | Status: DC | PRN
  Administered 2019-01-25: 11:00:00 via TOPICAL

## 2019-01-25 MED ORDER — THROMBIN 5000 UNITS EX SOLR
OROMUCOSAL | Status: DC | PRN
  Administered 2019-01-25: 11:00:00 via TOPICAL

## 2019-01-25 MED ORDER — THROMBIN 5000 UNITS EX SOLR
CUTANEOUS | Status: AC
  Filled 2019-01-25: qty 5000

## 2019-01-25 MED ORDER — MICROFIBRILLAR COLL HEMOSTAT EX POWD
CUTANEOUS | Status: AC
  Filled 2019-01-25: qty 5

## 2019-01-25 MED ORDER — MANNITOL 25 % IV SOLN
INTRAVENOUS | Status: DC | PRN
  Administered 2019-01-25: 12.5 g via INTRAVENOUS

## 2019-01-25 MED ORDER — ACETAMINOPHEN 650 MG RE SUPP: 650.0000 mg | RECTAL | Status: DC | PRN

## 2019-01-25 MED ORDER — POTASSIUM CHLORIDE IN NACL 20-0.9 MEQ/L-% IV SOLN
INTRAVENOUS | Status: DC
  Administered 2019-01-25: 14:00:00 950 mL via INTRAVENOUS
  Administered 2019-01-26: 07:00:00 via INTRAVENOUS
  Filled 2019-01-25 (×2): qty 1000

## 2019-01-25 MED ORDER — FENTANYL CITRATE (PF) 250 MCG/5ML IJ SOLN
INTRAMUSCULAR | Status: AC
  Filled 2019-01-25: qty 5

## 2019-01-25 MED ORDER — DEXTROSE-NACL 5-0.45 % IV SOLN: INTRAVENOUS | Status: DC

## 2019-01-25 MED ORDER — CHLORHEXIDINE GLUCONATE 0.12% ORAL RINSE (MEDLINE KIT)
15.0000 mL | Freq: Two times a day (BID) | OROMUCOSAL | Status: DC
  Administered 2019-01-25 – 2019-01-26 (×3): 15 mL via OROMUCOSAL

## 2019-01-25 MED ORDER — ACETAMINOPHEN 325 MG PO TABS
650.0000 mg | ORAL_TABLET | ORAL | Status: DC | PRN
  Administered 2019-01-26 (×3): 650 mg
  Filled 2019-01-25 (×3): qty 2

## 2019-01-25 MED ORDER — FENTANYL CITRATE (PF) 100 MCG/2ML IJ SOLN
INTRAMUSCULAR | Status: DC | PRN
  Administered 2019-01-25: 100 ug via INTRAVENOUS
  Administered 2019-01-25 (×2): 50 ug via INTRAVENOUS

## 2019-01-25 MED ORDER — FENTANYL CITRATE (PF) 100 MCG/2ML IJ SOLN: 25.0000 ug | INTRAMUSCULAR | Status: DC | PRN

## 2019-01-25 MED ORDER — THROMBIN 20000 UNITS EX SOLR
CUTANEOUS | Status: AC
  Filled 2019-01-25: qty 20000

## 2019-01-25 MED ORDER — PROPOFOL 1000 MG/100ML IV EMUL
INTRAVENOUS | Status: AC
  Filled 2019-01-25: qty 100

## 2019-01-25 MED ORDER — SODIUM CHLORIDE 0.9 % IV SOLN
INTRAVENOUS | Status: DC | PRN
  Administered 2019-01-25: 10:00:00 via INTRAVENOUS

## 2019-01-25 MED ORDER — ACETAMINOPHEN 650 MG RE SUPP
650.0000 mg | RECTAL | Status: DC | PRN
  Administered 2019-01-25 – 2019-01-26 (×2): 650 mg via RECTAL
  Filled 2019-01-25 (×2): qty 1

## 2019-01-25 MED ORDER — PROPOFOL 10 MG/ML IV BOLUS
INTRAVENOUS | Status: AC
  Filled 2019-01-25: qty 20

## 2019-01-25 MED ORDER — PROPOFOL 1000 MG/100ML IV EMUL
0.0000 ug/kg/min | INTRAVENOUS | Status: DC
  Administered 2019-01-25: 10 ug/kg/min via INTRAVENOUS
  Administered 2019-01-25: 16:00:00 20 ug/kg/min via INTRAVENOUS
  Administered 2019-01-26: 10 ug/kg/min via INTRAVENOUS
  Filled 2019-01-25 (×3): qty 100

## 2019-01-25 MED ORDER — BACITRACIN ZINC 500 UNIT/GM EX OINT
TOPICAL_OINTMENT | CUTANEOUS | Status: AC
  Filled 2019-01-25: qty 28.35

## 2019-01-25 MED ORDER — CEFAZOLIN SODIUM-DEXTROSE 2-4 GM/100ML-% IV SOLN
2.0000 g | Freq: Three times a day (TID) | INTRAVENOUS | Status: AC
  Administered 2019-01-25 – 2019-01-26 (×2): 2 g via INTRAVENOUS
  Filled 2019-01-25 (×2): qty 100

## 2019-01-25 MED ORDER — PROPOFOL 10 MG/ML IV BOLUS
INTRAVENOUS | Status: DC | PRN
  Administered 2019-01-25 (×2): 50 mg via INTRAVENOUS

## 2019-01-25 MED ORDER — INSULIN ASPART 100 UNIT/ML ~~LOC~~ SOLN
SUBCUTANEOUS | Status: AC
  Filled 2019-01-25: qty 1

## 2019-01-25 MED ORDER — ACETAMINOPHEN 325 MG PO TABS: 650.0000 mg | ORAL_TABLET | ORAL | Status: DC | PRN

## 2019-01-25 MED ORDER — PROPOFOL 500 MG/50ML IV EMUL
INTRAVENOUS | Status: DC | PRN
  Administered 2019-01-25: 50 ug/kg/min via INTRAVENOUS
  Administered 2019-01-25: 12:00:00 via INTRAVENOUS

## 2019-01-25 MED ORDER — SODIUM CHLORIDE 0.9 % IV SOLN
INTRAVENOUS | Status: DC | PRN
  Administered 2019-01-25: 50 ug/h via INTRAVENOUS

## 2019-01-25 MED ORDER — SODIUM CHLORIDE 3 % IV SOLN
INTRAVENOUS | Status: DC
  Administered 2019-01-25: 50 mL/h via INTRAVENOUS
  Filled 2019-01-25 (×2): qty 500

## 2019-01-25 MED ORDER — 0.9 % SODIUM CHLORIDE (POUR BTL) OPTIME
TOPICAL | Status: DC | PRN
  Administered 2019-01-25 (×2): 1000 mL

## 2019-01-25 MED ORDER — CHLORHEXIDINE GLUCONATE CLOTH 2 % EX PADS
6.0000 | MEDICATED_PAD | Freq: Every day | CUTANEOUS | Status: DC
  Administered 2019-01-26: 09:00:00 6 via TOPICAL

## 2019-01-25 MED ORDER — ORAL CARE MOUTH RINSE
15.0000 mL | OROMUCOSAL | Status: DC
  Administered 2019-01-25 – 2019-01-27 (×14): 15 mL via OROMUCOSAL

## 2019-01-25 MED ORDER — ROCURONIUM BROMIDE 10 MG/ML (PF) SYRINGE
PREFILLED_SYRINGE | INTRAVENOUS | Status: DC | PRN
  Administered 2019-01-25: 100 mg via INTRAVENOUS
  Administered 2019-01-25: 50 mg via INTRAVENOUS
  Administered 2019-01-25: 100 mg via INTRAVENOUS
  Administered 2019-01-25: 50 mg via INTRAVENOUS
  Administered 2019-01-25: 100 mg via INTRAVENOUS

## 2019-01-25 MED ORDER — CEFAZOLIN SODIUM-DEXTROSE 2-3 GM-%(50ML) IV SOLR
INTRAVENOUS | Status: DC | PRN
  Administered 2019-01-25: 2 g via INTRAVENOUS

## 2019-01-25 SURGICAL SUPPLY — 66 items
BIT DRILL WIRE PASS 1.3MM (BIT) ×1 IMPLANT
BUR PRECISION FLUTE 6.0 (BURR) ×3 IMPLANT
BUR SPIRAL ROUTER 2.3 (BUR) ×2 IMPLANT
BUR SPIRAL ROUTER 2.3MM (BUR) ×1
CANISTER SUCT 3000ML PPV (MISCELLANEOUS) ×6 IMPLANT
CARTRIDGE OIL MAESTRO DRILL (MISCELLANEOUS) ×1 IMPLANT
CATH VENTRIC 35X38 W/TROCAR LG (CATHETERS) ×3 IMPLANT
CONT SPEC 4OZ CLIKSEAL STRL BL (MISCELLANEOUS) ×3 IMPLANT
DIFFUSER DRILL AIR PNEUMATIC (MISCELLANEOUS) ×3 IMPLANT
DRAIN JACKSON PRT FLT 10 (DRAIN) ×3 IMPLANT
DRAPE LAPAROTOMY 100X72 PEDS (DRAPES) ×3 IMPLANT
DRAPE MICROSCOPE LEICA (MISCELLANEOUS) ×3 IMPLANT
DRAPE NEUROLOGICAL W/INCISE (DRAPES) ×3 IMPLANT
DRAPE SURG 17X23 STRL (DRAPES) ×3 IMPLANT
DRAPE WARM FLUID 44X44 (DRAPES) ×3 IMPLANT
DRILL WIRE PASS 1.3MM (BIT) ×3
ELECT REM PT RETURN 9FT ADLT (ELECTROSURGICAL) ×3
ELECTRODE REM PT RTRN 9FT ADLT (ELECTROSURGICAL) ×1 IMPLANT
EVACUATOR 1/8 PVC DRAIN (DRAIN) IMPLANT
EVACUATOR SILICONE 100CC (DRAIN) ×3 IMPLANT
FORCEPS BIPOLAR SPETZLER 8 1.0 (NEUROSURGERY SUPPLIES) ×3 IMPLANT
GAUZE 4X4 16PLY RFD (DISPOSABLE) IMPLANT
GAUZE SPONGE 4X4 12PLY STRL (GAUZE/BANDAGES/DRESSINGS) ×6 IMPLANT
GLOVE BIO SURGEON STRL SZ 6.5 (GLOVE) ×4 IMPLANT
GLOVE BIO SURGEON STRL SZ8 (GLOVE) ×3 IMPLANT
GLOVE BIO SURGEON STRL SZ8.5 (GLOVE) ×3 IMPLANT
GLOVE BIO SURGEONS STRL SZ 6.5 (GLOVE) ×2
GLOVE BIOGEL PI IND STRL 7.0 (GLOVE) ×2 IMPLANT
GLOVE BIOGEL PI IND STRL 7.5 (GLOVE) ×2 IMPLANT
GLOVE BIOGEL PI IND STRL 8 (GLOVE) ×2 IMPLANT
GLOVE BIOGEL PI INDICATOR 7.0 (GLOVE) ×4
GLOVE BIOGEL PI INDICATOR 7.5 (GLOVE) ×4
GLOVE BIOGEL PI INDICATOR 8 (GLOVE) ×4
GLOVE EXAM NITRILE XL STR (GLOVE) IMPLANT
GLOVE INDICATOR 6.5 STRL GRN (GLOVE) ×6 IMPLANT
GOWN STRL REUS W/ TWL LRG LVL3 (GOWN DISPOSABLE) ×2 IMPLANT
GOWN STRL REUS W/ TWL XL LVL3 (GOWN DISPOSABLE) ×3 IMPLANT
GOWN STRL REUS W/TWL 2XL LVL3 (GOWN DISPOSABLE) ×9 IMPLANT
GOWN STRL REUS W/TWL LRG LVL3 (GOWN DISPOSABLE) ×4
GOWN STRL REUS W/TWL XL LVL3 (GOWN DISPOSABLE) ×6
GRAFT SUTURABLE BP 6CMX8CM (Tissue) ×3 IMPLANT
HEMOSTAT POWDER KIT SURGIFOAM (HEMOSTASIS) ×3 IMPLANT
HEMOSTAT SURGICEL 2X14 (HEMOSTASIS) ×3 IMPLANT
KIT BASIN OR (CUSTOM PROCEDURE TRAY) ×3 IMPLANT
KIT DRAIN CSF ACCUDRAIN (MISCELLANEOUS) ×3 IMPLANT
KIT TURNOVER KIT B (KITS) ×3 IMPLANT
MARKER SKIN DUAL TIP RULER LAB (MISCELLANEOUS) IMPLANT
NEEDLE HYPO 22GX1.5 SAFETY (NEEDLE) ×3 IMPLANT
NS IRRIG 1000ML POUR BTL (IV SOLUTION) ×3 IMPLANT
OIL CARTRIDGE MAESTRO DRILL (MISCELLANEOUS) ×3
PACK CRANIOTOMY CUSTOM (CUSTOM PROCEDURE TRAY) ×3 IMPLANT
PIN MAYFIELD SKULL DISP (PIN) ×3 IMPLANT
SPONGE SURGIFOAM ABS GEL 100 (HEMOSTASIS) ×3 IMPLANT
STAPLER SKIN PROX WIDE 3.9 (STAPLE) ×3 IMPLANT
SUT NURALON 4 0 TR CR/8 (SUTURE) ×3 IMPLANT
SUT PROLENE 6 0 BV (SUTURE) IMPLANT
SUT SILK 0 TIES 10X30 (SUTURE) IMPLANT
SUT VIC AB 2-0 CP2 18 (SUTURE) ×9 IMPLANT
SUT VIC AB 3-0 FS2 27 (SUTURE) IMPLANT
SUT VICRYL 4-0 PS2 18IN ABS (SUTURE) IMPLANT
TAPE CLOTH SURG 4X10 WHT LF (GAUZE/BANDAGES/DRESSINGS) ×6 IMPLANT
TOWEL GREEN STERILE (TOWEL DISPOSABLE) ×3 IMPLANT
TOWEL GREEN STERILE FF (TOWEL DISPOSABLE) ×6 IMPLANT
TUBE CONNECTING 12'X1/4 (SUCTIONS) ×2
TUBE CONNECTING 12X1/4 (SUCTIONS) ×4 IMPLANT
WATER STERILE IRR 1000ML POUR (IV SOLUTION) ×3 IMPLANT

## 2019-01-25 NOTE — Consult Note (Signed)
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 On 11/14 sudden mental status change, blown L pupil, EVD not draining, and L hemiparesis prompted CT head which showed evolving L frontal infarct and hemorrhage in the tumor resection cavity evolving as expected with some surrounding edema. He was started on hypertonic saline. Shortly after the pt developed R blown pupil and was posturing. He was intubated and taken emergently to OR for Left orbital frontozygomatic craniectomy for partial left frontal lobectomy and evacuation of hematoma using microdissection; placement of ventriculostomy; placement of craniotomy flap in the abdominal subcutaneous tissue.   PCCM consulted for vent mgt.   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 On 11/14 sudden mental status change, blown L pupil, EVD not draining, and L hemiparesis prompted CT head which showed evolving L frontal infarct and hemorrhage in the tumor resection cavity evolving as expected with some surrounding edema. He was started on hypertonic saline. Shortly after the pt developed R blown pupil and was posturing. He was intubated and taken emergently to OR for Left orbital frontozygomatic craniectomy for partial left frontal lobectomy and evacuation of hematoma; placement of ventriculostomy; placement of craniotomy flap in the abdominal subcutaneous tissue.  PCCM consulted for vent mgt.  Past Medical History   Past Medical History:  Diagnosis Date  . Asthma   . Diabetes mellitus without complication ([place])       Significant Hospital Events   11/14: blown pupils, posturing, emergent OR for crani, placement of boneflap in abdomen   Consults:  PCCM   Procedures:  11/14 emergent OR for crani, placement of boneflap in abdomen   Significant Diagnostic Tests:  11/14 CT head  "1. Extensive motion degrades image quality 2. Progressive low-density left frontal lobe compatible with evolving infarct. Possible infarct right frontal lobe although this area is difficult to evaluate due to motion 3. Ventricular drain in good position with decreased ventricular hemorrhage. Suprasellar hemorrhage in the tumor bed is unchanged. No significant subdural hemorrhage 4. Increased mass-effect and midline shift to the right of 7 mm."  11/12 CT  Head  "1. Status post left orbital-zygomatic craniotomy for tumor resection. Large amount of blood at the resection bed with extension into the ventricular system. Limited assessment for residual tumor by CT. 2. Small amount of blood within the left frontal lobe and along the falx cerebri. 3. Left frontal approach EVD tip is at the level of the left foramen of Monro. No hydrocephalus."  11/9 CT Head "1. Large mass of the inferior frontal lobe/anterior cranial fossa. It is not entirely clear on this study whether the mass is intra or extra-axial. MRI of the brain with and without contrast is recommended. 2. Large area of hypoattenuation adjacent to the frontal horn of the left lateral ventricle and at the inferior right frontal lobe adjacent to the mass. The periventricular findings could be secondary to a dilated ventricle filled with proteinaceous debris. However, both areas may indicate cytotoxic edema in the setting of acute ischemia. 3. No intracranial hemorrhage."  Micro Data:  SARS  Coronavirus 2 negative MRSA surveillance negative   Antimicrobials:  Cefazolin SCIP 11/12 and 11/14  Interim history/subjective:  Back from OR. Intubated.   Objective    Blood pressure 98/74, pulse 94, temperature 97.8 F (36.6 C), temperature source Axillary, resp. rate 12, height _0  (1.88 m), weight 130 kg, SpO2 98 %.    Vent Mode: PRVC FiO2 (%):  [70 %] 70 % Set Rate:  [12 bmp] 12 bmp Vt Set:  [097 mL] 660 mL PEEP:  [5 cmH20] 5 cmH20   Intake/Output Summary (Last 24 hours) at 01/31/2019 1434 Last data filed at 01/28/2019 1238 Gross per 24 hour  Intake 3109.82 ml  Output 1725 ml  Net 1384.82 ml   Filed Weights   02/05/2019 2155 02/09/2019 0909  Weight: 130 kg 130 kg    Examination: General: Well-developed, well nourished. Critically ill appearing, obtunded. HENT: Scalp dressing in place CDI. Facial and calvarial edema.Pupils unable to assess d/t edema.  Moist mucus membranes Neck: No JVD. Trachea midline. CV: RRR. S1S2. No MRG. +2 distal pulses Lungs: BBS present, clear, FNL, symmetrical ABD: +BS x4. SNT/ND. No masses, guarding or rigidity GU: Foley EXT: flaccid. No edema Skin: PWD. In tact. No rashes or lesions Neuro: Unresponsive. RASS -4 on propofol     Resolved Hospital Problem list   NA  Assessment & Plan:  Acute respiratory failure 2/2 inability to protect airway from L frontal infarction and herniation s/p redo L crani  Ventilator support to prevent eminent deterioration and further organ dysfunction from hypoxemia and hypercarbia.   Patient is at risk for sudden hypoxia, barotrauma and hemodynamic compromise.   Maintain SpO2 greater than or equal to 90%. Head of bed elevated 30 degrees. Plateau pressures less than 30 cm H20.  Follow chest x-ray, ABG.   SAT/SBT as tolerated. Bronchial hygiene. RT/bronchodilator protocol. Neuroprotective measures: Maintain euthermia, euglycemia, eunatremia, normoxia, pCO2 35-40, nutrition and bowel regimen, seizure precautions, head of bed elevated. Could consider cEEG to eval for Sz. Defer to [place] per neurosurgery    Best practice:  Diet: NPO Pain/Anxiety/Delirium protocol  (if indicated): propofol, prn fentanyl  VAP protocol (if indicated): Yes  DVT prophylaxis: SCDs GI prophylaxis: PPI Glucose control: SSI  Mobility: BR Code Status: FULL  Family Communication: per primary  Disposition: ICU   Labs   CBC: Recent Labs  Lab 02/08/2019 2212 01/14/2019 0016 02/04/2019 1446 01/14/2019 1744 01/16/2019 1824 02/02/2019 1149  WBC 5.6 11.3*  --   --   --   --   HGB 14.7 10.6* 10.9* 10.5* 9.2* 12.2*  HCT 43.1 29.9* 32.0* 31.0* 27.0* 36.0*  MCV 92.7 90.6  --   --   --   --   PLT 257 144*  --   --   --   --     Basic Metabolic Panel: Recent Labs  Lab 02/01/2019 2212 02/05/2019 0016 01/29/2019 1446 01/15/2019 1744 01/15/2019 1824 01/28/2019 0900 01/20/2019 1149  NA 137  --  136 137 137 139 143  K 4.2  --  4.6 4.9 4.9  --  4.9  CL 99  --  104  --  104  --  107  CO2 26  --   --   --   --   --   --   GLUCOSE 143*  --  196*  --  239*  --  270*  BUN 17  --  10  --  11  --  8  CREATININE 0.90 0.87  0.70  --  0.80  --  0.60*  CALCIUM 9.8  --   --   --   --   --   --    GFR: Estimated Creatinine Clearance: 188.2 mL/min (A) (by C-G formula based on SCr of 0.6 mg/dL (L)). Recent Labs  Lab 01/14/2019 2212 01/13/2019 0016  WBC 5.6 11.3*    Liver Function Tests: No results for input(s): AST, ALT, ALKPHOS, BILITOT, PROT, ALBUMIN in the last 168 hours. No results for input(s): LIPASE, AMYLASE in the last 168 hours. No results for input(s): AMMONIA in the last 168 hours.  ABG    Component Value Date/Time   PHART 7.373 01/13/2019 1744   PCO2ART 39.8 01/31/2019 1744   PO2ART 271.0 (H) 01/28/2019 1744   HCO3 23.2 01/29/2019 1744   TCO2 27 02/07/2019 1149   ACIDBASEDEF 2.0 01/26/2019 1744   O2SAT 100.0 02/07/2019 1744     Coagulation Profile: No results for input(s): INR, PROTIME in the last 168 hours.  Cardiac Enzymes: No results for input(s): CKTOTAL, CKMB, CKMBINDEX, TROPONINI in the last 168 hours.  HbA1C: Hemoglobin A1C  Date/Time Value Ref Range Status  12/05/2017  02:01 PM 7.0 (A) 4.0 - 5.6 % Final  07/06/2017 09:41 AM 6.2  Final   Hgb A1c MFr Bld  Date/Time Value Ref Range Status  01/24/2019 09:45 AM 7.5 (H) 4.8 - 5.6 % Final    Comment:    (NOTE) Pre diabetes:          5.7%-6.4% Diabetes:              >6.4% Glycemic control for   <7.0% adults with diabetes     CBG: Recent Labs  Lab 01/24/19 1935 01/24/19 2324 02/04/2019 0316 02/08/2019 0727 02/09/2019 1332  GLUCAP 200* 183* 220* 258* 245*    Review of Systems:   UTO as pt is intubated and unresponsive   Past Medical History  He,  has a past medical history of Asthma and Diabetes mellitus without complication ([place]).   Surgical History    Past Surgical History:  Procedure Laterality Date  . CRANIOTOMY Left 01/30/2019   Procedure: LEFT ORBITO ZYGOMATIC CRANIOTOMY FOR TUMOR RESECTION WITH BRAINLAB;  Surgeon: Judith Part, MD;  Location: [facility];  Service: Neurosurgery;  Laterality: Left;     Social History   reports that he has never smoked. He has never used smokeless tobacco. He reports that he does not drink alcohol or use drugs.   Family History   His family history is not on file.   Allergies No Known Allergies   Home Medications  Prior to Admission medications   Medication Sig Start Date End Date Taking? Authorizing Provider  atorvastatin (LIPITOR) 40 MG tablet Take 40 mg by mouth daily. 08/13/18  Yes [provider]  cetirizine (ZYRTEC) 10 MG tablet Take 10 mg by mouth daily.   Yes [provider]  HUMULIN 70/30 (70-30) 100 UNIT/ML injection Inject 20 Units into the skin 2 (two) times daily. 10/31/18  Yes [provider]  lisinopril (ZESTRIL) 5 MG tablet Take 5 mg by mouth daily. 08/13/18  Yes [provider]  metFORMIN (GLUCOPHAGE) 1000 MG tablet Take 1,000 mg by mouth 2 (two) times daily. 11/12/18  Yes [provider]  blood glucose meter kit and supplies KIT Dispense based on patient and insurance preference. Use up to four  times daily as directed. (FOR ICD-9 250.00, 250.01). 03/12/17   Vanessa Kick, MD      The  patient is critically ill with respiratory failure. He requires ICU for high complexity decision making, titration of high alert medications, ventilator management, titration of oxygen and interpretation of advanced monitoring.    I personally spent 45 minutes providing critical care services including personally reviewing test results, discussing care with nursing staff/other physicians and completing orders pertaining to this patient.  Time was exclusive to the patient and does not include time spent teaching or in procedures.  Voice recognition software was used in the production of this record.  Errors in interpretation may have been inadvertently missed during review.  Francine Graven, MSN, AGACNP  [facility]

## 2019-01-25 NOTE — Anesthesia Postprocedure Evaluation (Signed)
Anesthesia Post Note  Patient: Brandon Henry  Procedure(s) Performed: LEFT FRONTAL CRANIOTOMY WITH PLACEMENT OF BONE FLAP IN ABDOMEN (Left Head)     Patient location during evaluation: ICU Anesthesia Type: General Level of consciousness: sedated and patient remains intubated per anesthesia plan Pain management: pain level controlled Vital Signs Assessment: post-procedure vital signs reviewed and stable Respiratory status: patient remains intubated per anesthesia plan Cardiovascular status: stable and tachycardic Postop Assessment: no apparent nausea or vomiting Anesthetic complications: no    Last Vitals:  Vitals:   02/09/2019 1300 02/01/2019 1309  BP: (!) 98/52 131/71  Pulse: (!) 107 (!) 105  Resp: (!) 24 12  Temp:  36.6 C  SpO2:  98%    Last Pain:  Vitals:   01/24/2019 1309  TempSrc: Axillary  PainSc:                  Audry Pili

## 2019-01-25 NOTE — Progress Notes (Signed)
Pt brought back from the OR intubated and placed on 8c/kg and RR and FIO2 anesthesia had the pt on during surgery.  RT will obtain a ABG in 1 hour.  RT will continue to monitor.

## 2019-01-25 NOTE — Anesthesia Procedure Notes (Signed)
Procedure Name: Intubation Date/Time: 02/09/2019 9:15 AM Performed by: Cleda Daub, CRNA Pre-anesthesia Checklist: Patient identified, Emergency Drugs available, Suction available and Patient being monitored Patient Re-evaluated:Patient Re-evaluated prior to induction Oxygen Delivery Method: Ambu bag Preoxygenation: Pre-oxygenation with 100% oxygen Induction Type: IV induction Ventilation: Mask ventilation without difficulty and Mask ventilation throughout procedure Laryngoscope Size: Glidescope and 4 Grade View: Grade I Tube type: Oral Tube size: 7.5 mm Number of attempts: 1 Airway Equipment and Method: Stylet and Video-laryngoscopy Placement Confirmation: ETT inserted through vocal cords under direct vision and breath sounds checked- equal and bilateral Secured at: 23 cm Tube secured with: Tape Dental Injury: Teeth and Oropharynx as per pre-operative assessment

## 2019-01-25 NOTE — Progress Notes (Signed)
Upon assessment pt was obtunded and tapping continuously on his abdomen. EVD not draining. Pt was not moving L side. L pupil was non reactive. Neurosurgery notified, STAT head CT ordered.

## 2019-01-25 NOTE — Op Note (Signed)
Brief history: The patient is a 33 year old black male who presented with a seizure and a brain tumor.  He underwent a craniotomy by Dr. Venetia Constable 2 days ago.  The patient became left hemiparetic this morning and dilated his left pupil.  A set head CT demonstrated a left frontal infarction with a hemorrhage in the tumor resection bed.  He was started on 3% saline but quickly became obtunded, posturing and dilated his right pupil.  He was emergently intubated.  I discussed the situation with the patient's wife and recommended surgery.  She has weighed the risks, benefits, likelihood of achieving our goals with surgery, and alternatives of surgery and consented on behalf of the patient.  Pre-op diagnosis: Left frontal infarction, intracerebral hemorrhage, tentorial herniation brain edema  Postop diagnosis: The same  Procedure: Left orbital frontozygomatic craniectomy for partial left frontal lobectomy and evacuation of hematoma using microdissection; placement of ventriculostomy; placement of craniotomy flap in the abdominal subcutaneous tissue  Surgeon: Dr. Earle Gell  Assistant: Dr. Kristeen Miss and Arnetha Massy nurse practitioner  Anesthesia: General tracheal  Estimated blood loss: 400 cc  Specimens: None  Drains: Ventriculostomy and subtemporal Jackson-Pratt drain  Complications: None  Description of procedure: The patient was emergently brought to the operating room by the anesthesia team.  General endotracheal anesthesia was induced.  I applied the [place] three-point headrest to the patient's calvarium.  A roll was placed under his left shoulder and his head was turned to the right exposing his left scalp.  I removed his dressing and remove the staples.  The patient's left frontal temporal parietal scalp was prepared with Betadine scrub and Betadine solution.  Sterile drapes were applied.  I then used a scalpel to incise through the patient's fresh craniotomy incision.  I used the  scissors to cut the sutures in the temporalis muscle.  We exposed the underlying care room with the periosteal elevator.  We used cerebellar retractors and fish hooks for scalp retraction.  We then remove the titanium mini screws from the old plate and then remove the craniotomy flap.  The underlying brain was necrotic and spontaneously extruding.  We cut the sutures in the dura.  The frontal lobe was quite herniated through the craniotomy flap.  We used bipolar electrocautery and suction and irrigation to remove the obviously necrotic and nonviable inferior left frontal lobe.  This gave [place] some room.  We applied the buddy halo retractor.  We then brought the operative microscope into the field and under its magnification and illumination we used microdissection and dissected deeper.  We encountered a hematoma in the tumor resection cavity which we removed again with suction and irrigation.  We did not encounter any significant bleeding but did encounter quite a bit of necrotic brain, which we removed with suction and bipolar electrocautery.  We removed it with suction and irrigation.  After we were satisfied with the resection cavity and decompression of the brain we used bipolar cautery to provide hemostasis.  We lined the resection cavity with Avitene.  We irrigated the resection cavity with bacitracin and saline solution.  It came back crystal-clear.  At this point the brain appeared adequately decompressed.  During the opening of the scalp the patient's old ventriculostomy tube was cut.  I removed it and placed a new ventricular catheter through the old track.  Tunneled it out through a separate stab wound.  We thought it would be better to not replace the patient's craniotomy flap because his brain was swollen.  The craniotomy flap was placed on a separate sterile field.  We then reapproximated the patient's dura loosely with a few interrupted Nurolon sutures.  We placed a dural substitute over the  exposed brain.  We then placed a 10 mm flat Jackson-Pratt drain in the subtemporal space and tunneled it out through a separate stab wound.  We then remove the retractors and reapproximated the patient's brows muscle with interrupted 2-0 Vicryl suture.  We reapproximated the galea with interrupted 2-0 Vicryl suture.  We reapproximated the skin with stainless steel staples.  We secured the ventriculostomy at the exit site with a 2-0 Vicryl suture.  We connected the ventriculostomy to a new sterile drainage system.  A sterile dressing was applied.  The drapes were removed.  We now turned our attention to the segment of the patient's craniotomy flap in the abdominal subcutaneous tissue.  The patient's left abdomen was shaved with clippers and prepared with DuraPrep.  General drapes were applied.  I injected the area to be incised with Marcaine with epinephrine solution.  I used a scalpel to make an incision in the patient's left abdomen.  I use electrocautery to create a pocket in the patient's abdominal fat.  We removed the plates and screws from the patient's craniotomy flap.  I placed the craniotomy flap in the nominal fat.  We then remove the retractors and reapproximated the patient's abdominal subcutaneous tissue with interrupted 2-0 Vicryl suture.  The skin was reapproximated with Steri-Strips and benzoin.  A sterile dressing was applied.  The drapes were removed.  By report all sponge, instrument, and needle counts were correct at the end of this case.

## 2019-01-25 NOTE — Progress Notes (Signed)
Subjective: While I was still in the ICU I was informed that the patient has blown his right pupil and is now posturing.  I immediately evaluated the patient.  No seizure activity has been noted.  Objective: Vital signs in last 24 hours: Temp:  [98 F (36.7 C)-101.4 F (38.6 C)] 101.4 F (38.6 C) (11/14 0800) Pulse Rate:  [65-95] 95 (11/14 0700) Resp:  [13-27] 27 (11/14 0700) BP: (116-143)/(48-83) 143/70 (11/14 0700) SpO2:  [97 %-100 %] 100 % (11/14 0700) Estimated body mass index is 36.8 kg/m as calculated from the following:   Height as of this encounter: 6\' 2"  (1.88 m).   Weight as of this encounter: 130 kg.   Intake/Output from previous day: 11/13 0701 - 11/14 0700 In: 786 [I.V.:686; IV Piggyback:100] Out: 2266 [Urine:2175; Drains:91] Intake/Output this shift: No intake/output data recorded.  Physical exam Glasgow Coma Scale [id number], he has left hemiplegia.  He decerebrate postures on the right.  Both of his pupils are large and dilated.  Lab Results: Recent Labs    02/09/2019 0016  02/08/2019 1744 01/13/2019 1824  WBC 11.3*  --   --   --   HGB 10.6*   < > 10.5* 9.2*  HCT 29.9*   < > 31.0* 27.0*  PLT 144*  --   --   --    < > = values in this interval not displayed.   BMET Recent Labs    01/16/2019 1446 01/12/2019 1744 02/08/2019 1824  NA 136 137 137  K 4.6 4.9 4.9  CL 104  --  104  GLUCOSE 196*  --  239*  BUN 10  --  11  CREATININE 0.70  --  0.80    Studies/Results: Ct Head Wo Contrast  Result Date: 01/31/2019 CLINICAL DATA:  Suprasellar tumor resection 01/26/2019. Acute mental status change. Follow-up hemorrhage. EXAM: CT HEAD WITHOUT CONTRAST TECHNIQUE: Contiguous axial images were obtained from the base of the skull through the vertex without intravenous contrast. COMPARISON:  CT head 02/09/2019 FINDINGS: Brain: Left supraorbital craniotomy for tumor resection. Extensive patient motion degrades image quality significantly on the study. Progressive low-density in  the left frontal lobe most likely acute infarct. Mild amount of hemorrhage within the infarct as noted previously. 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 There is high-density hemorrhage within the suprasellar cistern similar in amount to the prior CT. Intraventricular hemorrhage slightly improved due to ventricular drain. Left frontal ventricular drain extends into the left frontal horn in good position. 7 mm midline shift to the right is increased likely due to edema in the left frontal lobe. There is possible progression of edema in the right inferior frontal lobe although this area is difficult to evaluate due to motion. Progressive soft tissue swelling in the left scalp. Vascular: Limited evaluation Skull: Craniotomy flap in good position. Progressive soft tissue swelling left frontal scalp Sinuses/Orbits: Frontal sinuses clear. Craniotomy extends immediately adjacent to left frontal sinus but does not appear to enter the sinus. Other: None IMPRESSION: 1. Extensive motion degrades image quality 2. Progressive low-density left frontal lobe compatible with evolving infarct. Possible infarct right frontal lobe although this area is difficult to evaluate due to motion 3. Ventricular drain in good position with decreased ventricular hemorrhage. Suprasellar hemorrhage in the tumor bed is unchanged. No significant subdural hemorrhage 4. Increased mass-effect and midline shift to the right of 7 mm. Electronically Signed   By: Franchot Gallo M.D.   On: 01/28/2019 06:33   Ct Head Wo  Contrast  Result Date: 01/28/2019 CLINICAL DATA:  Seizure with vision loss. Status post tumor resection EXAM: CT HEAD WITHOUT CONTRAST TECHNIQUE: Contiguous axial images were obtained from the base of the skull through the vertex without intravenous contrast. COMPARISON:  Head CT 01/20/2019, brain MRI 01/22/2019 FINDINGS: Brain: Status post left orbital-zygomatic craniotomy for tumor resection. There is a large amount  blood at the resection bed in the suprasellar brain with extension into the ventricular system. There is no hydrocephalus. There is approximately 2 mm of rightward midline shift. There is subdural blood along the falx cerebri. Small amount of blood within the left frontal lobe. Left frontal approach EVD tip is at the level of the left foramen of Monro. Vascular: No visible abnormality Skull: Left orbital-zygomatic craniotomy Sinuses/Orbits: Postsurgical changes of the left bony orbit. Sinuses are clear. Other: None IMPRESSION: 1. Status post left orbital-zygomatic craniotomy for tumor resection. Large amount of blood at the resection bed with extension into the ventricular system. Limited assessment for residual tumor by CT. 2. Small amount of blood within the left frontal lobe and along the falx cerebri. 3. Left frontal approach EVD tip is at the level of the left foramen of Monro. No hydrocephalus. Electronically Signed   By: Ulyses Jarred M.D.   On: 01/18/2019 23:16    Assessment/Plan: Left frontal infarction, herniation: Unfortunately the patient is rapidly deteriorating.  I have called anesthesia to intubate the patient.  I posted the patient for surgery.  I called the patient's wife and updated her on his worsening condition.  I recommended a left craniotomy for partial lobectomy and evacuation of hematoma.  I described that surgery to her.  We have discussed the risks of surgery including the risk of anesthesia, hemorrhage, infection, seizures, infarction, medical risk, etc.  I have told her that I do not know whether this will help him but I would recommend proceeding with the surgery as described as a last ditch effort to save his life.  She has consented on the patient's behalf.  LOS: 5 days     Ophelia Charter 02/02/2019, 9:01 AM

## 2019-01-25 NOTE — Progress Notes (Signed)
I have spoken with the patient's family again, at his bedside.  I have answered all their questions.

## 2019-01-25 NOTE — Anesthesia Procedure Notes (Signed)
Arterial Line Insertion Start/End11/25/2020 9:45 AM, 01/25/2019 9:50 AM Performed by: Babs Bertin, CRNA, CRNA  Preanesthetic checklist: patient identified, IV checked and monitors and equipment checked Right, radial was placed Catheter size: 20 G Maximum sterile barriers used   Attempts: 2 Procedure performed without using ultrasound guided technique. Ultrasound Notes:anatomy identified Following insertion, Biopatch and dressing applied.

## 2019-01-25 NOTE — Progress Notes (Signed)
Occupational Therapy Discharge Patient Details Name: Brandon Henry MRN: [id number] DOB: [birth year] Today's Date: 02/06/2019 Time:  -     Patient discharged from OT services secondary to medical decline - will need to re-order OT to resume therapy services.  Please see latest therapy progress note for current level of functioning and progress toward goals.    Progress and discharge plan discussed with patient and/or caregiver: orders discontinued by MD.  Please reorder if he becomes medically appropriate for therapies    Brandon Henry, OTR/L [place] Pager [phone] Office [place], [place] Mccullum M 01/19/2019, 7:38 PM

## 2019-01-25 NOTE — Progress Notes (Signed)
The chaplain visited with the family as they are facing a difficult prognosis concerning their loved ones.  The chaplain provided prayer and listened to them as they grieve.  The chaplain will follow-up with the family as well as pass it on to the on-call chaplain throughout the weekend.  Brion Aliment Chaplain Resident For questions concerning this note please contact me by pager [phone]

## 2019-01-25 NOTE — Progress Notes (Signed)
Subjective: We were called regarding the patient's becoming weaker on the left.  Head CT was obtained.  The patient is in no apparent distress.  Objective: Vital signs in last 24 hours: Temp:  [98 F (36.7 C)-99.3 F (37.4 C)] 98.8 F (37.1 C) (11/14 0400) Pulse Rate:  [65-95] 95 (11/14 0700) Resp:  [13-27] 27 (11/14 0700) BP: (116-143)/(48-83) 143/70 (11/14 0700) SpO2:  [97 %-100 %] 100 % (11/14 0700) Estimated body mass index is 36.8 kg/m as calculated from the following:   Height as of this encounter: 6\' 2"  (1.88 m).   Weight as of this encounter: 130 kg.   Intake/Output from previous day: 11/13 0701 - 11/14 0700 In: 786 [I.V.:686; IV Piggyback:100] Out: 2266 [Urine:2175; Drains:91] Intake/Output this shift: No intake/output data recorded.  Physical exam Glasgow Coma Scale 8, [id number].  The patient is very purposeful on the right and will occasionally follow commands, squeezing my hand.  He is densely left hemiparetic.  His left pupil is 8 mm and nonreactive.  His right pupil is about 5 mm and sluggish.  I have reviewed the patient's follow-up head CT performed this morning.  It demonstrates an evolving left frontal infarct.  The hemorrhage in the tumor resection cavity is evolving as expected with some surrounding edema.  There is approximately 7 mm of midline shift.  His ventriculostomy is well-placed.  There is interventricular hemorrhage without much change.  Lab Results: Recent Labs    01/31/2019 0016  02/06/2019 1744 02/09/2019 1824  WBC 11.3*  --   --   --   HGB 10.6*   < > 10.5* 9.2*  HCT 29.9*   < > 31.0* 27.0*  PLT 144*  --   --   --    < > = values in this interval not displayed.   BMET Recent Labs    01/28/2019 1446 01/25/2019 1744 01/22/2019 1824  NA 136 137 137  K 4.6 4.9 4.9  CL 104  --  104  GLUCOSE 196*  --  239*  BUN 10  --  11  CREATININE 0.70  --  0.80    Studies/Results: Ct Head Wo Contrast  Result Date: 01/25/2019 CLINICAL DATA:  Suprasellar  tumor resection 01/13/2019. Acute mental status change. Follow-up hemorrhage. EXAM: CT HEAD WITHOUT CONTRAST TECHNIQUE: Contiguous axial images were obtained from the base of the skull through the vertex without intravenous contrast. COMPARISON:  CT head 01/30/2019 FINDINGS: Brain: Left supraorbital craniotomy for tumor resection. Extensive patient motion degrades image quality significantly on the study. Progressive low-density in the left frontal lobe most likely acute infarct. Mild amount of hemorrhage within the infarct as noted previously. 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 There is high-density hemorrhage within the suprasellar cistern similar in amount to the prior CT. Intraventricular hemorrhage slightly improved due to ventricular drain. Left frontal ventricular drain extends into the left frontal horn in good position. 7 mm midline shift to the right is increased likely due to edema in the left frontal lobe. There is possible progression of edema in the right inferior frontal lobe although this area is difficult to evaluate due to motion. Progressive soft tissue swelling in the left scalp. Vascular: Limited evaluation Skull: Craniotomy flap in good position. Progressive soft tissue swelling left frontal scalp Sinuses/Orbits: Frontal sinuses clear. Craniotomy extends immediately adjacent to left frontal sinus but does not appear to enter the sinus. Other: None IMPRESSION: 1. Extensive motion degrades image quality 2. Progressive low-density left frontal lobe compatible with  evolving infarct. Possible infarct right frontal lobe although this area is difficult to evaluate due to motion 3. Ventricular drain in good position with decreased ventricular hemorrhage. Suprasellar hemorrhage in the tumor bed is unchanged. No significant subdural hemorrhage 4. Increased mass-effect and midline shift to the right of 7 mm. Electronically Signed   By: Franchot Gallo M.D.   On: 01/15/2019 06:33   Ct  Head Wo Contrast  Result Date: 01/30/2019 CLINICAL DATA:  Seizure with vision loss. Status post tumor resection EXAM: CT HEAD WITHOUT CONTRAST TECHNIQUE: Contiguous axial images were obtained from the base of the skull through the vertex without intravenous contrast. COMPARISON:  Head CT 01/20/2019, brain MRI 01/22/2019 FINDINGS: Brain: Status post left orbital-zygomatic craniotomy for tumor resection. There is a large amount blood at the resection bed in the suprasellar brain with extension into the ventricular system. There is no hydrocephalus. There is approximately 2 mm of rightward midline shift. There is subdural blood along the falx cerebri. Small amount of blood within the left frontal lobe. Left frontal approach EVD tip is at the level of the left foramen of Monro. Vascular: No visible abnormality Skull: Left orbital-zygomatic craniotomy Sinuses/Orbits: Postsurgical changes of the left bony orbit. Sinuses are clear. Other: None IMPRESSION: 1. Status post left orbital-zygomatic craniotomy for tumor resection. Large amount of blood at the resection bed with extension into the ventricular system. Limited assessment for residual tumor by CT. 2. Small amount of blood within the left frontal lobe and along the falx cerebri. 3. Left frontal approach EVD tip is at the level of the left foramen of Monro. No hydrocephalus. Electronically Signed   By: Ulyses Jarred M.D.   On: 02/10/2019 23:16    Assessment/Plan: Left frontal infarct, left blown pupil: I think is left pupillary dilatation is likely secondary to postoperative edema, his resolving hematoma, and his infarct.  He does not appear to be herniating as he is quite purposeful on the right.  I believe his ventriculostomy is not draining because his ventricles are presently collapsed.  We will continue to observe this.  He does not have any hydrocephalus now.  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  I think the best course of action is to treat him with 3% saline and continue with observation.  We will plan to repeat his head scan tomorrow morning, or sooner if he would should worsen.  LOS: 5 days     Ophelia Charter 01/29/2019, 8:17 AM

## 2019-01-25 NOTE — Transfer of Care (Signed)
Immediate Anesthesia Transfer of Care Note  Patient: Brandon Henry  Procedure(s) Performed: LEFT FRONTAL CRANIOTOMY WITH PLACEMENT OF BONE FLAP IN ABDOMEN (Left Head)  Patient Location: ICU  Anesthesia Type:General  Level of Consciousness: Patient remains intubated per anesthesia plan  Airway & Oxygen Therapy: Patient remains intubated per anesthesia plan and Patient placed on Ventilator (see vital sign flow sheet for setting)  Post-op Assessment: Report given to RN and Post -op Vital signs reviewed and stable  Post vital signs: Reviewed and stable  Last Vitals:  Vitals Value Taken Time  BP 98/52 01/16/2019 1300  Temp    Pulse 102 01/22/2019 1307  Resp 12 02/09/2019 1307  SpO2 98 % 02/05/2019 1307  Vitals shown include unvalidated device data.  Last Pain:  Vitals:   01/13/2019 0800  TempSrc: Axillary  PainSc:          Complications: No apparent anesthesia complications

## 2019-01-25 NOTE — Progress Notes (Signed)
I spoke with the patient's wife, mother and a male family member.  I updated him on his condition and the surgery.  They understand that the patient is doing very admission.  I have answered all the questions.

## 2019-01-25 NOTE — Anesthesia Preprocedure Evaluation (Signed)
Anesthesia Evaluation  Patient identified by MRN, date of birth, ID band Patient unresponsive    Reviewed: Allergy & Precautions, Patient's Chart, lab work & pertinent test results, Unable to perform ROS - Chart review onlyPreop documentation limited or incomplete due to emergent nature of procedure.  History of Anesthesia Complications Negative for: history of anesthetic complications  Airway   TM Distance: >3 FB Neck ROM: Full   Comment: Unable to complete airway exam due to patient's unresponsiveness Dental   Pulmonary     + decreased breath sounds      Cardiovascular hypertension, Pt. 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/MCAs/ACAs   11/12 s/p L OZ craniotomy for tumor resection   Now with blown pupil, posturing     GI/Hepatic   Endo/Other  diabetes, Type 2, Insulin Dependent, Oral Hypoglycemic Agents Obesity   Renal/GU      Musculoskeletal   Abdominal (+) + obese,   Peds  Hematology  (+) anemia ,   Anesthesia Other Findings   Reproductive/Obstetrics                             Anesthesia Physical  Anesthesia Plan  ASA: V and emergent  Anesthesia Plan: General   Post-op Pain Management:    Induction: Intravenous  PONV Risk Score and Plan: 3 and Treatment may vary due to age or medical condition and Propofol infusion  Airway Management Planned: Oral ETT  Additional Equipment: Arterial line  Intra-op Plan:   Post-operative Plan: Post-operative intubation/ventilation  Informed Consent:     Only emergency history available and History available from chart only  Plan Discussed with: Anesthesiologist, CRNA and Surgeon  Anesthesia Plan Comments: (To be intubated in ICU emergently, then follow immediately to OR for emergent craniotomy and  decompression. No consent obtained by anesthesia given emergent process)        Anesthesia Quick Evaluation

## 2019-01-26 ENCOUNTER — Inpatient Hospital Stay (HOSPITAL_COMMUNITY): Payer: Self-pay

## 2019-01-26 DIAGNOSIS — R4182 Altered mental status, unspecified: Secondary | ICD-10-CM | POA: Diagnosis not present

## 2019-01-26 LAB — BASIC METABOLIC PANEL
Anion gap: 13 (ref 5–15)
Anion gap: 12 (ref 5–15)
BUN: 11 mg/dL (ref 6–20)
BUN: 15 mg/dL (ref 6–20)
CO2: 25 mmol/L (ref 22–32)
CO2: 22 mmol/L (ref 22–32)
Calcium: 9.8 mg/dL (ref 8.9–10.3)
Calcium: 9.7 mg/dL (ref 8.9–10.3)
Chloride: 119 mmol/L — ABNORMAL HIGH (ref 98–111)
Chloride: 122 mmol/L — ABNORMAL HIGH (ref 98–111)
Creatinine, Ser: 1.16 mg/dL (ref 0.61–1.24)
Creatinine, Ser: 1.28 mg/dL — ABNORMAL HIGH (ref 0.61–1.24)
GFR calc Af Amer: >60 mL/min (ref >60)
GFR calc Af Amer: >60 mL/min (ref >60)
GFR calc non Af Amer: >60 mL/min (ref >60)
GFR calc non Af Amer: >60 mL/min (ref >60)
Glucose, Bld: 243 mg/dL — ABNORMAL HIGH (ref 70–99)
Glucose, Bld: 248 mg/dL — ABNORMAL HIGH (ref 70–99)
Potassium: 4.2 mmol/L (ref 3.5–5.1)
Potassium: 4.4 mmol/L (ref 3.5–5.1)
Sodium: 157 mmol/L — ABNORMAL HIGH (ref 135–145)
Sodium: 156 mmol/L — ABNORMAL HIGH (ref 135–145)

## 2019-01-26 LAB — CBC
HCT: 41.3 % (ref 39.0–52.0)
Hemoglobin: 14.1 g/dL (ref 13.0–17.0)
MCH: 30.5 pg (ref 26.0–34.0)
MCHC: 34.1 g/dL (ref 30.0–36.0)
MCV: 89.2 fL (ref 80.0–100.0)
Platelets: 204 10*3/uL (ref 150–400)
RBC: 4.63 MIL/uL (ref 4.22–5.81)
RDW: 16.2 % — ABNORMAL HIGH (ref 11.5–15.5)
WBC: 14.6 10*3/uL — ABNORMAL HIGH (ref 4.0–10.5)
nRBC: 0 % (ref 0.0–0.2)

## 2019-01-26 LAB — BPAM RBC
Blood Product Expiration Date: 202012032359
Blood Product Expiration Date: 202012032359
Blood Product Expiration Date: 202012032359
Blood Product Expiration Date: 202012052359
ISSUE DATE / TIME: 202011141004
ISSUE DATE / TIME: 202011141004
ISSUE DATE / TIME: 202011141047
ISSUE DATE / TIME: 202011141047
Unit Type and Rh: 600
Unit Type and Rh: 600
Unit Type and Rh: 600
Unit Type and Rh: 600

## 2019-01-26 LAB — TYPE AND SCREEN
ABO/RH(D): A NEG
Antibody Screen: NEGATIVE
Unit division: 0
Unit division: 0
Unit division: 0
Unit division: 0

## 2019-01-26 LAB — GLUCOSE, CAPILLARY
Glucose-Capillary: 220 mg/dL — ABNORMAL HIGH (ref 70–99)
Glucose-Capillary: 233 mg/dL — ABNORMAL HIGH (ref 70–99)
Glucose-Capillary: 244 mg/dL — ABNORMAL HIGH (ref 70–99)
Glucose-Capillary: 237 mg/dL — ABNORMAL HIGH (ref 70–99)
Glucose-Capillary: 279 mg/dL — ABNORMAL HIGH (ref 70–99)

## 2019-01-26 LAB — TRIGLYCERIDES: Triglycerides: 147 mg/dL (ref <150)

## 2019-01-26 LAB — OSMOLALITY: Osmolality: 341 mOsm/kg (ref 275–295)

## 2019-01-26 LAB — OSMOLALITY, URINE: Osmolality, Ur: 301 mOsm/kg (ref 300–900)

## 2019-01-26 MED ORDER — SODIUM CHLORIDE 0.45 % IV BOLUS
1000.0000 mL | Freq: Once | INTRAVENOUS | Status: AC
  Administered 2019-01-26: 1000 mL via INTRAVENOUS

## 2019-01-26 MED ORDER — METOPROLOL TARTRATE 5 MG/5ML IV SOLN
5.0000 mg | INTRAVENOUS | Status: DC | PRN
  Administered 2019-01-26 (×7): 5 mg via INTRAVENOUS
  Filled 2019-01-26 (×4): qty 5

## 2019-01-26 MED ORDER — INSULIN GLARGINE 100 UNIT/ML ~~LOC~~ SOLN
20.0000 [IU] | Freq: Two times a day (BID) | SUBCUTANEOUS | Status: DC
  Administered 2019-01-26: 20 [IU] via SUBCUTANEOUS
  Filled 2019-01-26 (×4): qty 0.2

## 2019-01-26 MED ORDER — SODIUM CHLORIDE 0.45 % IV SOLN
INTRAVENOUS | Status: DC
  Administered 2019-01-26: 14:00:00 via INTRAVENOUS

## 2019-01-26 MED ORDER — DESMOPRESSIN ACETATE 4 MCG/ML IJ SOLN
2.0000 ug | Freq: Two times a day (BID) | INTRAMUSCULAR | Status: DC
  Administered 2019-01-26 (×2): 2 ug via INTRAVENOUS
  Filled 2019-01-26 (×2): qty 1

## 2019-01-26 MED ORDER — PRO-STAT SUGAR FREE PO LIQD
30.0000 mL | Freq: Two times a day (BID) | ORAL | Status: DC
  Administered 2019-01-26 (×2): 30 mL
  Filled 2019-01-26: qty 30

## 2019-01-26 MED ORDER — NOREPINEPHRINE 4 MG/250ML-% IV SOLN: 2.0000 ug/min | INTRAVENOUS | Status: DC

## 2019-01-26 MED ORDER — PHENYLEPHRINE HCL-NACL 10-0.9 MG/250ML-% IV SOLN
0.0000 ug/min | INTRAVENOUS | Status: DC
  Administered 2019-01-26: 23:00:00 80 ug/min via INTRAVENOUS
  Administered 2019-01-27: 115.333 ug/min via INTRAVENOUS
  Filled 2019-01-26 (×3): qty 250

## 2019-01-26 MED ORDER — SODIUM CHLORIDE 0.9 % IV SOLN
250.0000 mL | INTRAVENOUS | Status: DC
  Administered 2019-01-26: 250 mL via INTRAVENOUS

## 2019-01-26 MED ORDER — INSULIN GLARGINE 100 UNIT/ML ~~LOC~~ SOLN
15.0000 [IU] | Freq: Two times a day (BID) | SUBCUTANEOUS | Status: DC
  Administered 2019-01-26: 15 [IU] via SUBCUTANEOUS
  Filled 2019-01-26 (×2): qty 0.15

## 2019-01-26 MED ORDER — VITAL HIGH PROTEIN PO LIQD
1000.0000 mL | ORAL | Status: DC
  Administered 2019-01-26: 1000 mL

## 2019-01-26 MED ORDER — INSULIN GLARGINE 100 UNIT/ML ~~LOC~~ SOLN: 10.0000 [IU] | Freq: Two times a day (BID) | SUBCUTANEOUS | Status: DC

## 2019-01-26 MED ORDER — PHENYLEPHRINE HCL-NACL 10-0.9 MG/250ML-% IV SOLN
INTRAVENOUS | Status: AC
  Administered 2019-01-26: 80 ug/min via INTRAVENOUS
  Filled 2019-01-26: qty 250

## 2019-01-26 MED ORDER — SODIUM CHLORIDE 0.45 % IV BOLUS
500.0000 mL | Freq: Once | INTRAVENOUS | Status: AC
  Administered 2019-01-26: 500 mL via INTRAVENOUS

## 2019-01-26 NOTE — Progress Notes (Signed)
CDC referral made. Spoke with Brandon Henry.  Referral # [id number]

## 2019-01-26 NOTE — Progress Notes (Signed)
Patient progressively getting worse, BP dropped with a MAP of 52.  Recieved orders and started 2 bolus's and pressors. Started all of these and BP continuously would get better and drop again. O2 continuously dropping and suctioned out vomit from ETT.   Patient still not responding and propofol off. Temperature is now at 97.7 form 102.4 and has started the hiccups.  Paged neurosurgery and CCM has been alongside the entire time. Family was advised to come in

## 2019-01-26 NOTE — Progress Notes (Addendum)
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 On 11/14 sudden mental status change, blown L pupil, EVD not draining, and L hemiparesis prompted CT head which showed evolving L frontal infarct and hemorrhage in the tumor resection cavity evolving as expected with some surrounding edema. He was started on hypertonic saline. Shortly after the pt developed R blown pupil and was posturing. He was intubated and taken emergently to OR for Left orbital frontozygomatic craniectomy for partial left frontal lobectomy and evacuation of hematoma using microdissection; placement of ventriculostomy; placement of craniotomy flap in the abdominal subcutaneous tissue.   PCCM consulted for vent mgt.   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 On 11/14 sudden mental status change, blown L pupil, EVD not draining, and L hemiparesis prompted CT head which showed evolving L frontal infarct and hemorrhage in the tumor resection cavity evolving as expected with some surrounding edema. He was started on hypertonic saline. Shortly after the pt developed R blown pupil and was posturing. He was intubated and taken emergently to OR for Left orbital frontozygomatic craniectomy for partial left frontal lobectomy and evacuation of hematoma; placement of ventriculostomy; placement of craniotomy flap in the abdominal subcutaneous tissue.  PCCM consulted for vent mgt.  Past Medical History   Past Medical History:  Diagnosis Date  . Asthma   . Diabetes mellitus without complication ([place])       Significant Hospital Events   11/14: blown pupils, posturing, emergent OR for crani, placement of boneflap in abdomen   Consults:  PCCM   Procedures:  11/14 emergent OR for crani, placement of boneflap in abdomen   Significant Diagnostic Tests:  11/15 CT Head pending>  11/14 CT Head  "1. Extensive motion degrades image quality 2. Progressive low-density left frontal lobe compatible with evolving infarct. Possible infarct right frontal lobe although this area is difficult to evaluate due to motion 3. Ventricular drain in good position with decreased ventricular hemorrhage. Suprasellar hemorrhage in the tumor bed is unchanged. No significant subdural hemorrhage 4. Increased mass-effect and midline shift to the right of 7 mm."  11/12 CT  Head  "1. Status post left orbital-zygomatic craniotomy for tumor resection. Large amount of blood at the resection bed with extension into the ventricular system. Limited assessment for residual tumor by CT. 2. Small amount of blood within the left frontal lobe and along the falx cerebri. 3. Left frontal approach EVD tip is at the level of the left foramen of Monro. No hydrocephalus."  11/9 CT Head "1. Large mass of the inferior frontal lobe/anterior cranial fossa. It is not entirely clear on this study whether the mass is intra or extra-axial. MRI of the brain with and without contrast is recommended. 2. Large area of hypoattenuation adjacent to the frontal horn of the left lateral ventricle and at the inferior right frontal lobe adjacent to the mass. The periventricular findings could be secondary to a dilated ventricle filled with proteinaceous debris. However, both areas may indicate cytotoxic edema in the setting of acute ischemia. 3. No intracranial hemorrhage."  Micro Data:  SARS Coronavirus 2 negative MRSA surveillance negative   Antimicrobials:  Cefazolin SCIP 11/12 and 11/14  Interim history/subjective:  High UO post op,  decreased overnight. Poor neuro exam. Follow CT head results pending.   Objective   Blood pressure 94/70, pulse (!) 129, temperature (!) 100.6 F (38.1 C), temperature source Axillary, resp. rate 15, height 6\' 2"  (1.88 m), weight 130 kg, SpO2 97 %.    Vent Mode: PRVC FiO2 (%):  [50 %-70 %] 50 % Set Rate:  [12 bmp] 12 bmp Vt Set:  [650 mL-660 mL] 650 mL PEEP:  [5 cmH20] 5 cmH20 Plateau Pressure:  [17 cmH20-20 cmH20] 17 cmH20   Intake/Output Summary (Last 24 hours) at 01/26/2019 0854 Last data filed at 01/26/2019 0600 Gross per 24 hour  Intake 4177.36 ml  Output 7705 ml  Net -3527.64 ml   Filed Weights   01/28/2019 2155 01/29/2019 0909  Weight: 130 kg 130 kg    Examination: General: Well-developed, well nourished. Critically ill appearing, adult male HENT: Scalp dressing in place CDI. EVD in place, minimal drainage. Facial and calvarial edema improved. Pupils 7mm b/l non reactive.  Moist mucus membranes Neck: No JVD. Trachea midline. CV: RRR. S1S2. No MRG. +2 distal pulses Lungs: BBS present, clear, FNL, symmetrical. Vent supported  ABD: +BS x4. SNT/ND. No masses, guarding or rigidity GU: Foley EXT: flaccid. No edema Skin: PWD. In tact. No rashes or lesions Neuro: slight decorticate posturing of lower EXT to noxious stimuli, R >L. Does not withdraw in upper EXT. Does not blink to threat. + cough/gag    Resolved Hospital Problem list   NA  Assessment & Plan:  Acute respiratory failure 2/2 inability to protect airway from L frontal infarction and herniation s/p redo L crani  -continue ventilator support to prevent eminent deterioration and further organ dysfunction from hypoxemia and hypercarbia.   -Patient is at risk for sudden hypoxia, barotrauma and hemodynamic compromise.   -Maintain SpO2 greater than or equal to 90%. -Head of bed elevated 30 degrees. -Plateau pressures less than 30 cm H20.  -Follow chest x-ray, ABG.   -SAT/SBT as tolerated.  -Bronchial hygiene.  -RT/bronchodilator protocol. -Neuroprotective measures: Maintain euthermia, euglycemia, eunatremia, normoxia, pCO2 35-40, nutrition and bowel regimen, seizure precautions, head of bed elevated. Could consider cEEG to eval for Sz. Defer to NSGY   DM with hyperglycemia  -resistant SSI regimen. Add lantus 15U BID -may need insulin gtt   Hypernatremia with probable DI. High UO expected PO and /c hypertonic. Slightly decreased overnight but still high. -check urine and serum osmo. Consider DDAVP  -IVF as per NSGY  Rest per neurosurgery    Best practice:  Diet: NPO. Consult nutrition for TF  recs  Pain/Anxiety/Delirium protocol (if indicated): propofol, prn fentanyl  VAP protocol (if indicated): Yes  DVT prophylaxis: SCDs GI prophylaxis: PPI Glucose control: SSI  Mobility: BR Code Status: FULL  Family Communication: per primary  Disposition: ICU   Labs   CBC: Recent Labs  Lab 01/18/2019 2212 02/09/2019 0016  02/10/2019 1744 02/03/2019 1824 02/10/2019 1149 01/16/2019 1621 01/26/19 0533  WBC 5.6 11.3*  --   --   --   --   --  14.6*  HGB 14.7 10.6*   < > 10.5* 9.2* 12.2* 12.9* 14.1  HCT 43.1 29.9*   < > 31.0* 27.0* 36.0* 38.0* 41.3  MCV 92.7 90.6  --   --   --   --   --  89.2  PLT 257 144*  --   --   --   --   --  204   < > = values in this interval not displayed.    Basic Metabolic Panel: Recent Labs  Lab 02/07/2019 2212 01/12/2019 0016 01/19/2019 1446 01/21/2019 1744 01/12/2019 1824 01/26/2019 0900 02/01/2019 1149 01/26/2019 1621 01/26/19 0533  NA 137  --  136 137 137 139 143 148* 157*  K 4.2  --  4.6 4.9 4.9  --  4.9 5.3* 4.2  CL 99  --  104  --  104  --  107  --  119*  CO2 26  --   --   --   --   --   --   --  25  GLUCOSE 143*  --  196*  --  239*  --  270*  --  243*  BUN 17  --  10  --  11  --  8  --  11  CREATININE 0.90 0.87 0.70  --  0.80  --  0.60*  --  1.16  CALCIUM 9.8  --   --   --   --   --   --   --  9.8   GFR: Estimated Creatinine Clearance: 129.8 mL/min (by C-G formula  based on SCr of 1.16 mg/dL). Recent Labs  Lab 02/09/2019 2212 01/13/2019 0016 01/26/19 0533  WBC 5.6 11.3* 14.6*    Liver Function Tests: No results for input(s): AST, ALT, ALKPHOS, BILITOT, PROT, ALBUMIN in the last 168 hours. No results for input(s): LIPASE, AMYLASE in the last 168 hours. No results for input(s): AMMONIA in the last 168 hours.  ABG    Component Value Date/Time   PHART 7.336 (L) 01/24/2019 1621   PCO2ART 44.0 02/06/2019 1621   PO2ART 189.0 (H) 02/06/2019 1621   HCO3 23.7 02/10/2019 1621   TCO2 25 01/29/2019 1621   ACIDBASEDEF 2.0 01/24/2019 1621   O2SAT 100.0 02/05/2019 1621     Coagulation Profile: No results for input(s): INR, PROTIME in the last 168 hours.  Cardiac Enzymes: No results for input(s): CKTOTAL, CKMB, CKMBINDEX, TROPONINI in the last 168 hours.  HbA1C: Hemoglobin A1C  Date/Time Value Ref Range Status  12/05/2017 02:01 PM 7.0 (A) 4.0 - 5.6 % Final  07/06/2017 09:41 AM 6.2  Final   Hgb A1c MFr Bld  Date/Time Value Ref Range Status  01/24/2019 09:45 AM 7.5 (H) 4.8 - 5.6 % Final    Comment:    (NOTE) Pre diabetes:          5.7%-6.4% Diabetes:              >6.4% Glycemic control for   <7.0% adults with diabetes     CBG: Recent Labs  Lab 01/15/2019 1535 01/26/2019 1939 02/06/2019 2329 01/26/19 0315 01/26/19 0720  GLUCAP 263* 250* 228* 220* 233*       The patient is critically ill with respiratory failure. He requires ICU for high complexity decision making, titration of high alert medications, ventilator management, titration of oxygen and interpretation of advanced monitoring.    I personally spent 35 minutes providing critical care services including personally reviewing test results, discussing care with nursing staff/other physicians and completing orders pertaining to this patient.  Time was exclusive to the patient and does not include time spent teaching or in procedures.  Voice recognition software was used in the production  of this record.  Errors in interpretation may have been inadvertently missed during review.  Francine Graven, MSN, AGACNP  [facility]    Patient examined from doorway, multiple  family members in room grieving so deferred exam.  Continues to be comatose on vent.  May be in DI, will try to keep up with losses.  CT head results noted.  We will continue supportive ICU care while family comes to terms with dismal prognosis.  Erskine Emery MD

## 2019-01-26 NOTE — Progress Notes (Signed)
Brief Nutrition Note RD working remotely.  Consult received for enteral/tube feeding initiation and management.  Adult Enteral Nutrition Protocol initiated. Full assessment to follow.  Enteral Access: OGT placed today; terminates in proximal stomach per abdominal x-ray from today  Admitting Dx: Seizure ([place]) [R56.9] Benign neoplasm of brain, unspecified brain region [facility]) [D33.2]  Body mass index is 36.8 kg/m. Pt meets criteria for obesity class II based on current BMI.  Labs:  Recent Labs  Lab 02/07/2019 2212  02/09/2019 1149 01/31/2019 1621 01/26/19 0533 01/26/19 1206  NA 137   < > 143 148* 157* 156*  K 4.2   < > 4.9 5.3* 4.2 4.4  CL 99   < > 107  --  119* 122*  CO2 26  --   --   --  25 22  BUN 17   < > 8  --  11 15  CREATININE 0.90   < > 0.60*  --  1.16 1.28*  CALCIUM 9.8  --   --   --  9.8 9.7  GLUCOSE 143*   < > 270*  --  243* 248*   < > = values in this interval not displayed.    Jacklynn Barnacle, MS, RD, LDN Office: [phone] Pager: [phone] After Hours/Weekend Pager: [phone]

## 2019-01-26 NOTE — Progress Notes (Signed)
I have reviewed the patient's head CT performed this morning.  He has had a left frontal craniectomy and partial left frontal lobectomy with a small amount of hemorrhage.  The ventriculostomy is not within the ventricle.  There is no hydrocephalus.  He has had a right anterior cerebral artery infarct.  There is tumor/blood in the suprasellar cistern.  I do not think he would benefit from more surgery.  His prognosis is poor.  I have spoken with the patient's mother and wife and informed them of the results of the CT scan.  I have told them that prognosis is very poor.  I have answered all their questions.  We will continue with supportive care.

## 2019-01-26 NOTE — Progress Notes (Signed)
Subjective: The patient is comatose, intubated, and in no apparent distress.  He has been sedated with propofol.  His head CT is pending.  His family has not arrived yet.  Objective: Vital signs in last 24 hours: Temp:  [97.8 F (36.6 C)-101.8 F (38.8 C)] 101.8 F (38.8 C) (11/15 0400) Pulse Rate:  [92-170] 108 (11/15 0700) Resp:  [12-24] 12 (11/15 0700) BP: (88-150)/(51-81) 88/70 (11/15 0700) SpO2:  [97 %-100 %] 97 % (11/15 0700) Arterial Line BP: (94-138)/(55-77) 122/68 (11/15 0600) FiO2 (%):  [60 %-70 %] 60 % (11/15 0415) Estimated body mass index is 36.8 kg/m as calculated from the following:   Height as of this encounter: 6\' 2"  (1.88 m).   Weight as of this encounter: 130 kg.   Intake/Output from previous day: 11/14 0701 - 11/15 0700 In: 4177.4 [I.V.:2517.4; Blood:1260; IV Piggyback:400] Out: 7705 [Urine:7125; Drains:80; Blood:500] Intake/Output this shift: No intake/output data recorded.  Physical exam vascular coma scale 4, intubated, [id number].  He will rotate his right leg with painful stimuli.  He has a cough.  His pupils are 2 mm bilaterally.  His ventriculostomy has not had any output.  Lab Results: Recent Labs    01/31/2019 1621 01/26/19 0533  WBC  --  14.6*  HGB 12.9* 14.1  HCT 38.0* 41.3  PLT  --  204   BMET Recent Labs    01/29/2019 1149 02/08/2019 1621 01/26/19 0533  NA 143 148* 157*  K 4.9 5.3* 4.2  CL 107  --  119*  CO2  --   --  25  GLUCOSE 270*  --  243*  BUN 8  --  11  CREATININE 0.60*  --  1.16  CALCIUM  --   --  9.8    Studies/Results: Ct Head Wo Contrast  Result Date: 01/16/2019 CLINICAL DATA:  Suprasellar tumor resection 01/26/2019. Acute mental status change. Follow-up hemorrhage. EXAM: CT HEAD WITHOUT CONTRAST TECHNIQUE: Contiguous axial images were obtained from the base of the skull through the vertex without intravenous contrast. COMPARISON:  CT head 02/05/2019 FINDINGS: Brain: Left supraorbital craniotomy for tumor resection.  Extensive patient motion degrades image quality significantly on the study. Progressive low-density in the left frontal lobe most likely acute infarct. Mild amount of hemorrhage within the infarct as noted previously. 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 There is high-density hemorrhage within the suprasellar cistern similar in amount to the prior CT. Intraventricular hemorrhage slightly improved due to ventricular drain. Left frontal ventricular drain extends into the left frontal horn in good position. 7 mm midline shift to the right is increased likely due to edema in the left frontal lobe. There is possible progression of edema in the right inferior frontal lobe although this area is difficult to evaluate due to motion. Progressive soft tissue swelling in the left scalp. Vascular: Limited evaluation Skull: Craniotomy flap in good position. Progressive soft tissue swelling left frontal scalp Sinuses/Orbits: Frontal sinuses clear. Craniotomy extends immediately adjacent to left frontal sinus but does not appear to enter the sinus. Other: None IMPRESSION: 1. Extensive motion degrades image quality 2. Progressive low-density left frontal lobe compatible with evolving infarct. Possible infarct right frontal lobe although this area is difficult to evaluate due to motion 3. Ventricular drain in good position with decreased ventricular hemorrhage. Suprasellar hemorrhage in the tumor bed is unchanged. No significant subdural hemorrhage 4. Increased mass-effect and midline shift to the right of 7 mm. Electronically Signed   By: Franchot Gallo M.D.  On: 01/23/2019 06:33    Assessment/Plan: Postop day #3/1: We are awaiting a CT scan.  His prognosis remains poor.  Hypernatremia: The patient has had high urine output.  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  I will change his IV fluids to half-normal saline to give more free water.  We will recheck his BMP in 4 hours.  He may  ultimately need DDAVP.  I appreciate CCM's help.  LOS: 6 days     Ophelia Charter 01/26/2019, 8:05 AM

## 2019-01-26 NOTE — Progress Notes (Addendum)
[facility] Progress Note Patient Name: Brandon Henry DOB: [birth year] MRN: [id number]   Date of Service  01/26/2019  HPI/Events of Note  MAP on art line < 60.  Left orbital frontozygomatic craniectomy for partial left frontal lobectomy and evacuation of hematoma using microdissection; placement of ventriculostomy; placement of craniotomy flap in the abdominal subcutaneous tissue.  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 On 11/14 sudden mental status change, blown L pupil, EVD not draining, and L hemiparesis prompted CT head which showed evolving L frontal infarct and hemorrhage in the tumor resection cavity evolving as expected with some surrounding edema.  Camera: HR 128, sats 97%. MAP < 60. Propofol. On fluids.   Poor prognosis as per neuro surgery notes. Family awrae.   eICU Interventions  - started Levophed gtt. - notified bed side MD/Dr Duwayne Heck.  - hypernatremia. Watch for DI.  - NG to suction for any residuals. - sz precautions. - fluids prn.      Intervention Category Intermediate Interventions: Hypotension - evaluation and management  Elmer Sow 01/26/2019, 10:53 PM   23:00 NG 250 greenish bilious fluid. NPO.  IV half NS 500 ml bolus ordered also.

## 2019-01-27 LAB — RENAL FUNCTION PANEL
Albumin: 2.4 g/dL — ABNORMAL LOW (ref 3.5–5.0)
Anion gap: 12 (ref 5–15)
BUN: 19 mg/dL (ref 6–20)
CO2: 16 mmol/L — ABNORMAL LOW (ref 22–32)
Calcium: 8.4 mg/dL — ABNORMAL LOW (ref 8.9–10.3)
Chloride: 121 mmol/L — ABNORMAL HIGH (ref 98–111)
Creatinine, Ser: 1.01 mg/dL (ref 0.61–1.24)
GFR calc Af Amer: >60 mL/min (ref >60)
GFR calc non Af Amer: >60 mL/min (ref >60)
Glucose, Bld: 327 mg/dL — ABNORMAL HIGH (ref 70–99)
Phosphorus: 2.2 mg/dL — ABNORMAL LOW (ref 2.5–4.6)
Potassium: 4 mmol/L (ref 3.5–5.1)
Sodium: 149 mmol/L — ABNORMAL HIGH (ref 135–145)

## 2019-01-27 LAB — POCT I-STAT 7, (LYTES, BLD GAS, ICA,H+H)
Acid-base deficit: 2 mmol/L (ref 0.0–2.0)
Bicarbonate: 21.5 mmol/L (ref 20.0–28.0)
Calcium, Ion: 1.07 mmol/L — ABNORMAL LOW (ref 1.15–1.40)
HCT: 21 % — ABNORMAL LOW (ref 39.0–52.0)
Hemoglobin: 7.1 g/dL — ABNORMAL LOW (ref 13.0–17.0)
O2 Saturation: 100 %
Potassium: 3.1 mmol/L — ABNORMAL LOW (ref 3.5–5.1)
Sodium: 145 mmol/L (ref 135–145)
TCO2: 22 mmol/L (ref 22–32)
pCO2 arterial: 31.4 mmHg — ABNORMAL LOW (ref 32.0–48.0)
pH, Arterial: 7.443 (ref 7.350–7.450)
pO2, Arterial: 510 mmHg — ABNORMAL HIGH (ref 83.0–108.0)

## 2019-01-27 LAB — GLUCOSE, CAPILLARY: Glucose-Capillary: 276 mg/dL — ABNORMAL HIGH (ref 70–99)

## 2019-01-27 LAB — TRIGLYCERIDES: Triglycerides: 190 mg/dL — ABNORMAL HIGH (ref <150)

## 2019-01-27 LAB — MAGNESIUM: Magnesium: 1.6 mg/dL — ABNORMAL LOW (ref 1.7–2.4)

## 2019-01-27 MED ORDER — MORPHINE 100MG IN NS 100ML (1MG/ML) PREMIX INFUSION
0.0000 mg/h | INTRAVENOUS | Status: DC
  Administered 2019-01-27: 05:00:00 4 mg/h via INTRAVENOUS
  Filled 2019-01-27: qty 100

## 2019-01-27 MED ORDER — POLYVINYL ALCOHOL 1.4 % OP SOLN: 1.0000 [drp] | Freq: Four times a day (QID) | OPHTHALMIC | Status: DC | PRN

## 2019-01-27 MED ORDER — LORAZEPAM 2 MG/ML IJ SOLN: 2.0000 mg | INTRAMUSCULAR | Status: DC | PRN

## 2019-01-27 MED ORDER — ACETAMINOPHEN 325 MG PO TABS: 650.0000 mg | ORAL_TABLET | Freq: Four times a day (QID) | ORAL | Status: DC | PRN

## 2019-01-27 MED ORDER — SODIUM CHLORIDE 0.9 % IV SOLN
3.0000 g | Freq: Four times a day (QID) | INTRAVENOUS | Status: DC
  Administered 2019-01-27: 3 g via INTRAVENOUS
  Filled 2019-01-27 (×2): qty 8
  Filled 2019-01-27: qty 3
  Filled 2019-01-27 (×3): qty 8

## 2019-01-27 MED ORDER — MORPHINE SULFATE (PF) 2 MG/ML IV SOLN: 2.0000 mg | INTRAVENOUS | Status: DC | PRN

## 2019-01-27 MED ORDER — GLYCOPYRROLATE 0.2 MG/ML IJ SOLN: 0.2000 mg | INTRAMUSCULAR | Status: DC | PRN

## 2019-01-27 MED ORDER — DIPHENHYDRAMINE HCL 50 MG/ML IJ SOLN: 25.0000 mg | INTRAMUSCULAR | Status: DC | PRN

## 2019-01-27 MED ORDER — GLYCOPYRROLATE 1 MG PO TABS: 1.0000 mg | ORAL_TABLET | ORAL | Status: DC | PRN

## 2019-01-27 MED ORDER — MORPHINE BOLUS VIA INFUSION
5.0000 mg | INTRAVENOUS | Status: DC | PRN
  Filled 2019-01-27: qty 5

## 2019-01-27 MED ORDER — DEXTROSE 5 % IV SOLN: INTRAVENOUS | Status: DC

## 2019-01-27 MED ORDER — MORPHINE SULFATE (PF) 4 MG/ML IV SOLN
4.0000 mg | Freq: Once | INTRAVENOUS | Status: AC
  Administered 2019-01-27: 4 mg via INTRAVENOUS
  Filled 2019-01-27: qty 1

## 2019-01-27 MED ORDER — ACETAMINOPHEN 650 MG RE SUPP: 650.0000 mg | Freq: Four times a day (QID) | RECTAL | Status: DC | PRN

## 2019-01-29 ENCOUNTER — Encounter (HOSPITAL_COMMUNITY): Payer: Self-pay | Admitting: Neurosurgery

## 2019-02-03 LAB — SURGICAL PATHOLOGY

## 2019-02-11 NOTE — Progress Notes (Signed)
After discussion with the family and the CCM MD the family decided to make the patient a DNR.  Once they saw the patient's heart rate go up they ultimately decided they did not want him to suffer and to remain on life support. They have requested that we make him comfortable and place patient on comfort care, removing the breathing tube and supportive medications.   CDS updated as well as CCM.

## 2019-02-11 NOTE — Progress Notes (Signed)
[facility] Progress Note Patient Name: Brandon Henry DOB: January 08, 1986 MRN: [id number]   Date of Service  02/15/19  HPI/Events of Note  Bed side called to get orders for comfort care. family decided for comfort care, withdraw tube and morphine for comfort. Notified bed side CCM.   eICU Interventions  CCM end of life care ordered.         Elmer Sow 02-15-19, 4:37 AM

## 2019-02-11 NOTE — Progress Notes (Signed)
Wasted 177ml morphine in sink w/ Budd Palmer, RN.

## 2019-02-11 NOTE — Discharge Summary (Signed)
Discharge Summary  Date of Admission: 01/26/2019  Date of Discharge: 02/09/2019  Attending Physician: Emelda Brothers, MD  Hospital Course: Patient was admitted with a new onset seizure in the setting of progressive visual loss. A CT and then MRI showed a large cystic enhancing suprasellar mass L>R encasing the ICAs/MCAs/ACAs, extending past the cavernous sinus on the L, multiple cystic foci with large left frontal cystic structure, optic nerves compressed and displaced, infundibulum is midline and displaced. 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 On 11/12 he was taken to the OR for a left OZ craniotomy for tumor resection w/ EVD placement. Intraoperatively, the tumor appeared to be infiltrating chiasm & arising from the 3rd, not attached to skull base and infiltrating the parenchyma, so a partial resection performed. Post-operatively, his vision was worse than preop and a CTH was obtained which showed some resection bed hemorrhage without significant mass effect, new IVH with EVD in place. At that time, he was awake and FCx4 but with decreased vision. The next morning, the patient had some left sided weakness and left pupillary dilation which showed an evolving L frontal infarct and started on hypertonic saline. Shortly thereafter, he started posturing and blew his right pupil, so he was taken to the OR for removal of the bone flap, evacuation of hematoma, and partial frontal lobectomy. Unfortunately, post-operatively he continued to worsen with fixed & dilated pupils, remained intubated, developed diabetes insipidus. Repeat CTH showed bilateral ACA infarcts with concern for diffuse herniation. This was discussed with the family and they opted to withdraw care. He was terminally extubated and died on 2019-02-09 at 06:18.   Discharge diagnosis: Brain tumor  Judith Part, MD 02/09/19 5:23 PM

## 2019-02-11 NOTE — Progress Notes (Signed)
Patient's family decided to proceed with end of life comfort measures. CDS called me with the family support to question about donation. The wife had a panic attack outside of the door when presented the information but wished he was in better shape to be able to donate.   Patient was extubated, morphine drip was started and supportive measures were stopped.   Chaplain at bedside

## 2019-02-11 NOTE — Progress Notes (Signed)
Pt will not be a ME case per Izora Ribas

## 2019-02-11 NOTE — Progress Notes (Signed)
Assisted family with camera/video time via elink 

## 2019-02-11 NOTE — Procedures (Signed)
Extubation Procedure Note  Patient Details:   Name: Brandon Henry DOB: 1985-11-01 MRN: [id number]   Airway Documentation:    Vent end date: 02-16-2019 Vent end time: 0520   Evaluation  O2 sats: stable throughout Complications: No apparent complications  Patient did tolerate procedure well. Bilateral Breath Sounds: Rhonchi, Diminished   No. Pt was a compassionate extubation per family wishes and MD order.   Roby Lofts Mansfield Dann 2019/02/16, 5:30 AM

## 2019-02-11 NOTE — Progress Notes (Signed)
Provided ministry of presence to multiple family members while determining end of life.  Rev. [place].

## 2019-02-11 NOTE — Progress Notes (Signed)
Multiple conversations with the family throughout the evening, including the wife Caryl Pina, his mother, his two aunts and uncle.   Patient has worsened overnight, including hypotension, tachycardia, tachypnea.  Aspiration event seemed to occur earlier this evening as well.   Discussed the large ischemic strokes, brain swelling and poor prognosis.  Discussed patient with the neurosurgeon on call, who is very familiar with the patient.  Ordering sodium level, as well as starting antibiotics.  Cont current supportive management.  Family has been discussing possible comfort care.  I brought up DNR, they are having a very difficult time making any decisions, understandably.   I have placed a DNR order based on my assessment of their underlying wishes, and futility of chest compressions given his underlying severe brain injury.

## 2019-02-11 NOTE — Progress Notes (Addendum)
Patient expired at [place], second RN was Delfin Gant.

## 2019-02-11 DEATH — deceased

## 2019-08-27 ENCOUNTER — Telehealth: Payer: Self-pay | Admitting: Urgent Care

## 2019-09-01 NOTE — Telephone Encounter (Signed)
Pt's mom-Andrea left a vm on medical records recorder asking for pt's medical records. I called her back and spoke with her. I let her know I would be more than happy to release his records; however, she will need to provide [place] with the last will and testament naming her as the POA or a letter from the judge listing her as his executor of real estate.  She understood what I said.

## 2020-11-08 IMAGING — CT CT HEAD W/O CM
2 series · 15 of 30 positions shown, 17 images · non-contrast
Comparison: CT head 01/23/2019

CLINICAL DATA: Suprasellar tumor resection 01/23/2019. Acute mental
status change. Follow-up hemorrhage.

EXAM:
CT HEAD WITHOUT CONTRAST
TECHNIQUE: Contiguous axial images were obtained from the base of the skull
through the vertex without intravenous contrast.

[Series 3: head wo · axial · 0.51mm/px · z∈[-135,-15]mm · 7 of 32 slices shown, 9 images]
[im 4/32  brain]
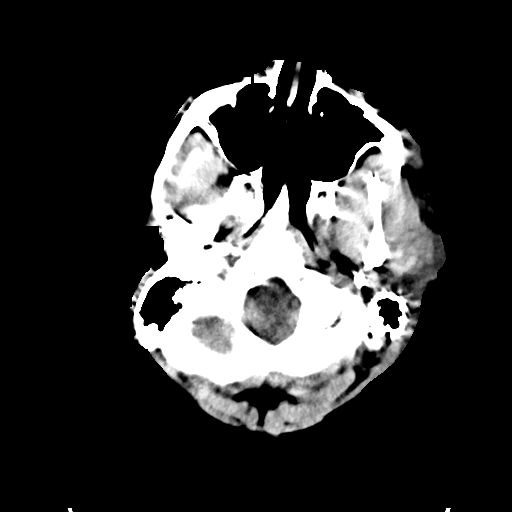
[im 4/32  bone]
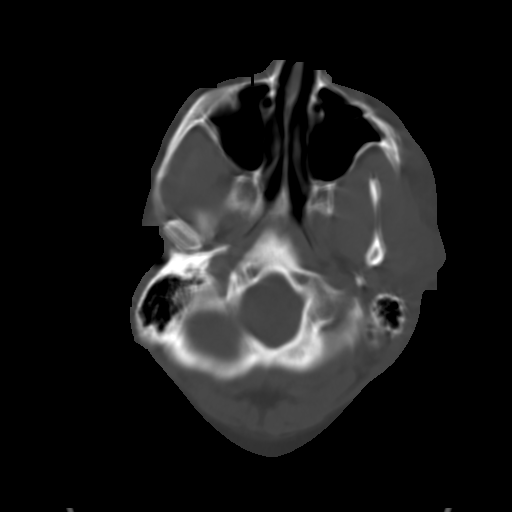
[im 8/32  brain]
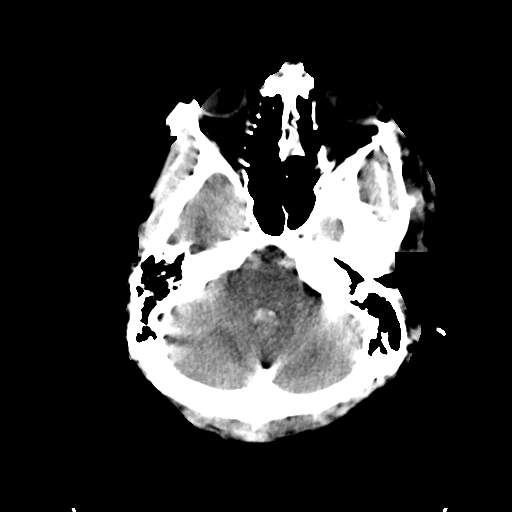
[im 12/32  brain]
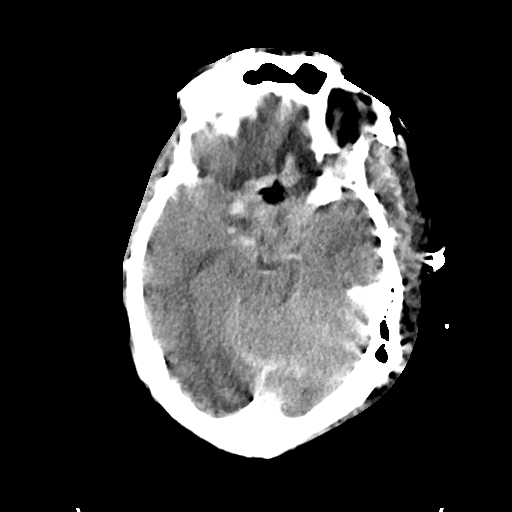
[im 16/32  brain]
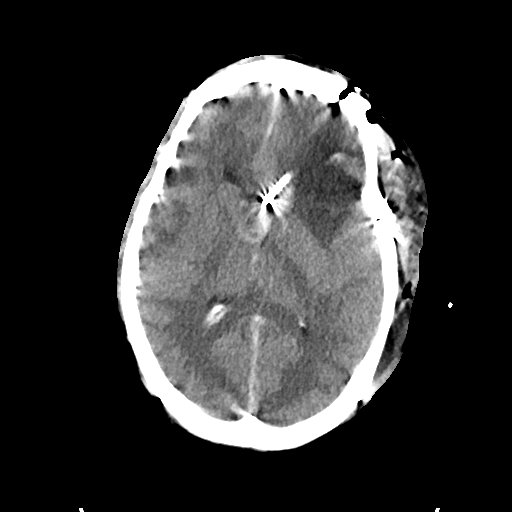
[im 20/32  brain]
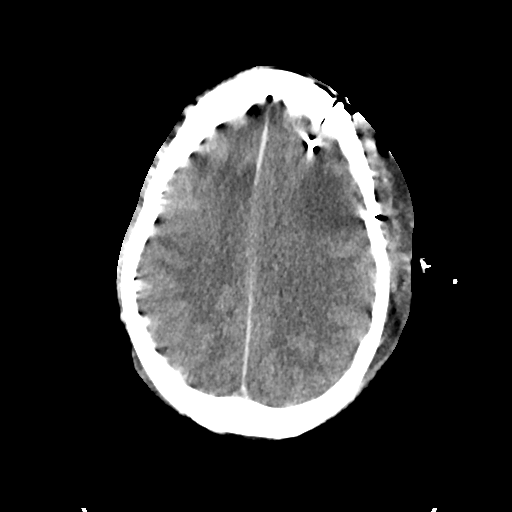
[im 20/32  bone]
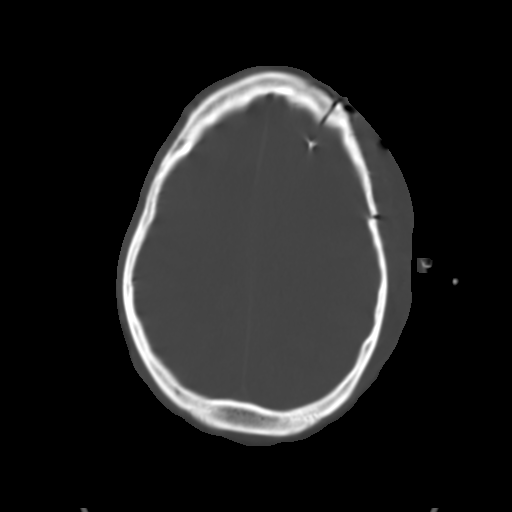
[im 24/32  brain]
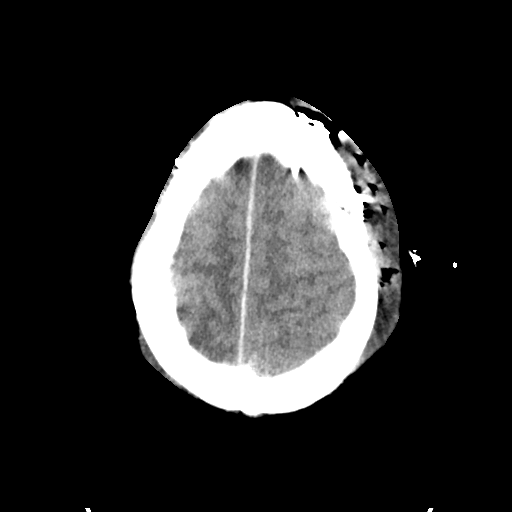
[im 28/32  brain]
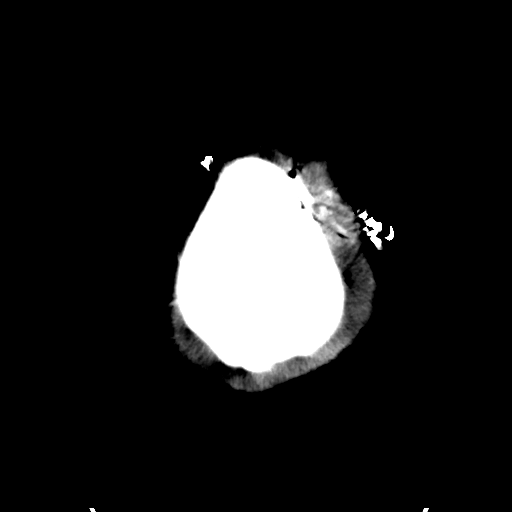

[Series 4: head bone · axial · 0.51mm/px · z∈[-136,-8]mm · 8 of 80 slices shown]
[im 8/80  bone]
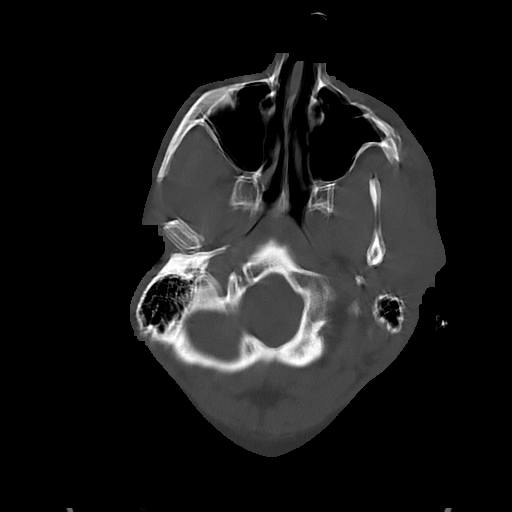
[im 16/80  bone]
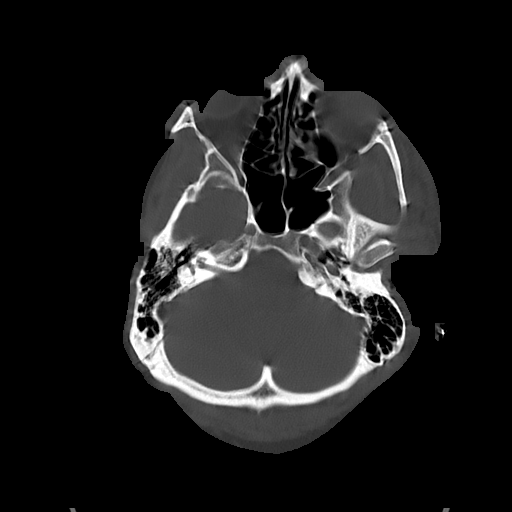
[im 24/80  bone]
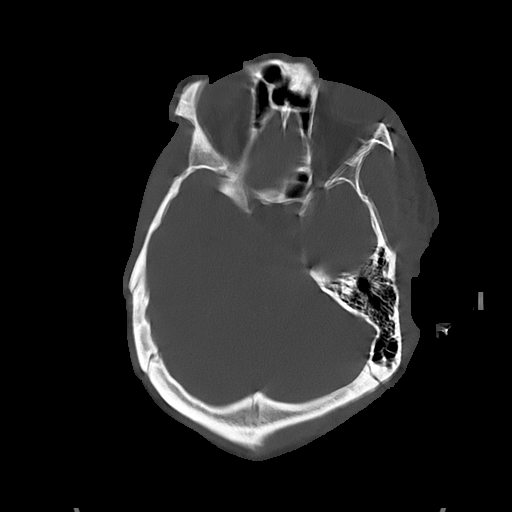
[im 36/80  bone]
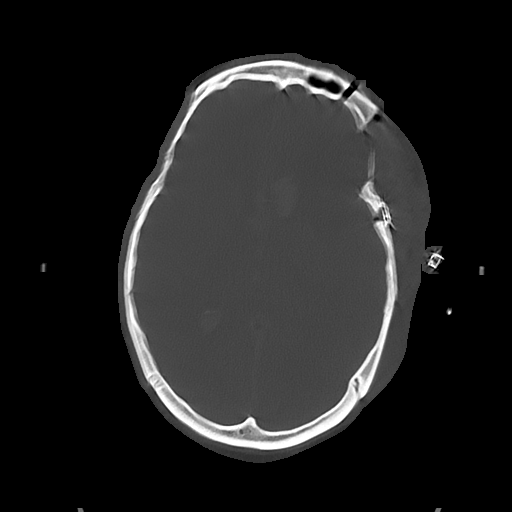
[im 44/80  bone]
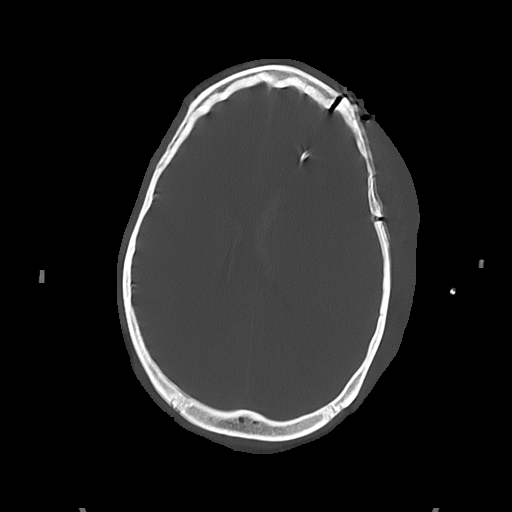
[im 56/80  bone]
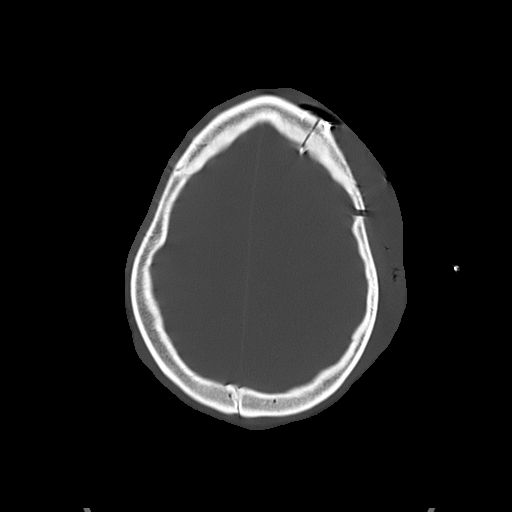
[im 64/80  bone]
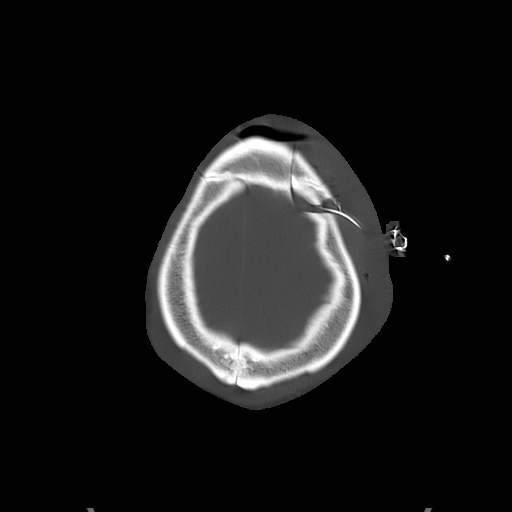
[im 72/80  bone]
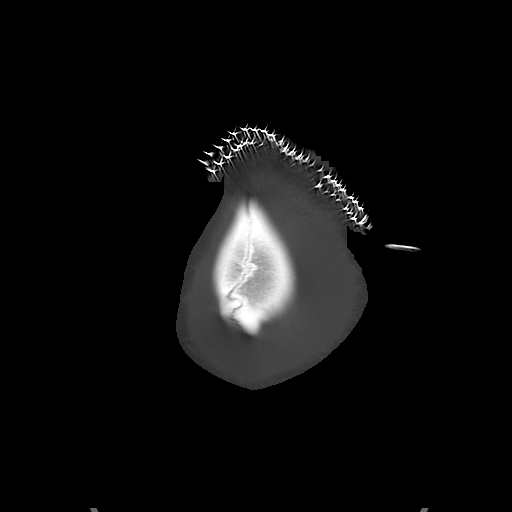

[15 of 30 positions shown; findings below may reference images not displayed]

FINDINGS: Brain: Left supraorbital craniotomy for tumor resection. Extensive
patient motion degrades image quality significantly on the study.

Progressive low-density in the left frontal lobe most likely acute
infarct. Mild amount of hemorrhage within the infarct as noted
previously.

Suprasellar tumor difficult to identify on the study. There is
high-density hemorrhage within the suprasellar cistern similar in
amount to the prior CT. Intraventricular hemorrhage slightly
improved due to ventricular drain. Left frontal ventricular drain
extends into the left frontal horn in good position.

7 mm midline shift to the right is increased likely due to edema in
the left frontal lobe. There is possible progression of edema in the
right inferior frontal lobe although this area is difficult to
evaluate due to motion.

Progressive soft tissue swelling in the left scalp.

Vascular: Limited evaluation

Skull: Craniotomy flap in good position. Progressive soft tissue
swelling left frontal scalp

Sinuses/Orbits: Frontal sinuses clear. Craniotomy extends
immediately adjacent to left frontal sinus but does not appear to
enter the sinus.

Other: None
IMPRESSION: 1. Extensive motion degrades image quality
2. Progressive low-density left frontal lobe compatible with
evolving infarct. Possible infarct right frontal lobe although this
area is difficult to evaluate due to motion
3. Ventricular drain in good position with decreased ventricular
hemorrhage. Suprasellar hemorrhage in the tumor bed is unchanged. No
significant subdural hemorrhage
4. Increased mass-effect and midline shift to the right of 7 mm.
